# Patient Record
Sex: Male | Born: 1950
Health system: Southern US, Community
[De-identification: ages and names within clinical notes are randomized; demographics above are authoritative.]

## PROBLEM LIST (undated history)

## (undated) DIAGNOSIS — I10 Essential (primary) hypertension: Secondary | ICD-10-CM

## (undated) DIAGNOSIS — E785 Hyperlipidemia, unspecified: Secondary | ICD-10-CM

## (undated) DIAGNOSIS — H269 Unspecified cataract: Secondary | ICD-10-CM

## (undated) DIAGNOSIS — Z972 Presence of dental prosthetic device (complete) (partial): Secondary | ICD-10-CM

## (undated) DIAGNOSIS — R7303 Prediabetes: Secondary | ICD-10-CM

## (undated) HISTORY — PX: APPENDECTOMY: SHX54

## (undated) HISTORY — PX: CATARACT EXTRACTION, BILATERAL: SHX1313

## (undated) HISTORY — DX: Essential (primary) hypertension: I10

## (undated) HISTORY — DX: Unspecified cataract: H26.9

## (undated) HISTORY — PX: HERNIA REPAIR: SHX51

## (undated) HISTORY — PX: TONSILLECTOMY: SUR1361

## (undated) HISTORY — DX: Hyperlipidemia, unspecified: E78.5

---

## 2003-12-30 ENCOUNTER — Other Ambulatory Visit: Payer: Self-pay

## 2007-07-04 ENCOUNTER — Ambulatory Visit: Payer: Self-pay | Admitting: Gastroenterology

## 2007-07-04 LAB — HM COLONOSCOPY

## 2008-08-10 ENCOUNTER — Ambulatory Visit: Payer: Self-pay | Admitting: General Surgery

## 2008-08-17 ENCOUNTER — Ambulatory Visit: Payer: Self-pay | Admitting: General Surgery

## 2009-04-23 ENCOUNTER — Ambulatory Visit: Payer: Self-pay | Admitting: Cardiology

## 2009-04-23 HISTORY — PX: CARDIAC CATHETERIZATION: SHX172

## 2010-12-09 ENCOUNTER — Ambulatory Visit: Payer: Self-pay | Admitting: Unknown Physician Specialty

## 2010-12-30 ENCOUNTER — Ambulatory Visit: Payer: Self-pay | Admitting: Unknown Physician Specialty

## 2015-02-22 LAB — BASIC METABOLIC PANEL
BUN: 13 mg/dL (ref 4–21)
CREATININE: 1 mg/dL (ref <=1.3)
Glucose: 121 mg/dL
Potassium: 5 mmol/L (ref 3.4–5.3)
Sodium: 141 mmol/L (ref 137–147)

## 2015-02-22 LAB — HEPATIC FUNCTION PANEL
ALK PHOS: 104 U/L (ref 25–125)
ALT: 45 U/L — AB (ref 10–40)
AST: 28 U/L (ref 14–40)
BILIRUBIN, TOTAL: 0.3 mg/dL

## 2015-02-22 LAB — CBC AND DIFFERENTIAL
HCT: 42 % (ref 41–53)
HEMOGLOBIN: 14.2 g/dL (ref 13.5–17.5)
Neutrophils Absolute: 68 /uL
Platelets: 244 10*3/uL (ref 150–399)
WBC: 10 10*3/mL

## 2015-02-22 LAB — PSA: PSA: 1.4

## 2015-02-22 LAB — LIPID PANEL
Cholesterol: 145 mg/dL (ref 0–200)
HDL: 23 mg/dL — AB (ref 35–70)
Triglycerides: 480 mg/dL — AB (ref 40–160)

## 2015-02-22 LAB — HEMOGLOBIN A1C: Hgb A1c MFr Bld: 7.1 % — AB (ref 4.0–6.0)

## 2015-02-22 LAB — TSH: TSH: 1.42 u[IU]/mL (ref <=5.90)

## 2015-03-28 DIAGNOSIS — F419 Anxiety disorder, unspecified: Secondary | ICD-10-CM | POA: Insufficient documentation

## 2015-03-28 DIAGNOSIS — E669 Obesity, unspecified: Secondary | ICD-10-CM | POA: Insufficient documentation

## 2015-03-28 DIAGNOSIS — F329 Major depressive disorder, single episode, unspecified: Secondary | ICD-10-CM | POA: Insufficient documentation

## 2015-03-28 DIAGNOSIS — M109 Gout, unspecified: Secondary | ICD-10-CM | POA: Insufficient documentation

## 2015-03-28 DIAGNOSIS — M1990 Unspecified osteoarthritis, unspecified site: Secondary | ICD-10-CM | POA: Insufficient documentation

## 2015-03-28 DIAGNOSIS — E349 Endocrine disorder, unspecified: Secondary | ICD-10-CM | POA: Insufficient documentation

## 2015-03-28 DIAGNOSIS — E559 Vitamin D deficiency, unspecified: Secondary | ICD-10-CM | POA: Insufficient documentation

## 2015-03-28 DIAGNOSIS — E119 Type 2 diabetes mellitus without complications: Secondary | ICD-10-CM | POA: Insufficient documentation

## 2015-03-28 DIAGNOSIS — K219 Gastro-esophageal reflux disease without esophagitis: Secondary | ICD-10-CM | POA: Insufficient documentation

## 2015-03-28 DIAGNOSIS — E785 Hyperlipidemia, unspecified: Secondary | ICD-10-CM | POA: Insufficient documentation

## 2015-03-28 DIAGNOSIS — F32A Depression, unspecified: Secondary | ICD-10-CM | POA: Insufficient documentation

## 2015-03-28 DIAGNOSIS — E7800 Pure hypercholesterolemia, unspecified: Secondary | ICD-10-CM | POA: Insufficient documentation

## 2015-03-28 DIAGNOSIS — Z720 Tobacco use: Secondary | ICD-10-CM | POA: Insufficient documentation

## 2015-04-14 ENCOUNTER — Encounter: Payer: Self-pay | Admitting: Emergency Medicine

## 2015-04-14 ENCOUNTER — Emergency Department: Payer: PRIVATE HEALTH INSURANCE

## 2015-04-14 ENCOUNTER — Emergency Department
Admission: EM | Admit: 2015-04-14 | Discharge: 2015-04-14 | Disposition: A | Discharge status: Home / Self Care | Payer: PRIVATE HEALTH INSURANCE | Attending: Emergency Medicine | Admitting: Emergency Medicine

## 2015-04-14 DIAGNOSIS — R197 Diarrhea, unspecified: Secondary | ICD-10-CM | POA: Insufficient documentation

## 2015-04-14 DIAGNOSIS — Z7952 Long term (current) use of systemic steroids: Secondary | ICD-10-CM | POA: Insufficient documentation

## 2015-04-14 DIAGNOSIS — Z79899 Other long term (current) drug therapy: Secondary | ICD-10-CM | POA: Diagnosis not present

## 2015-04-14 DIAGNOSIS — E119 Type 2 diabetes mellitus without complications: Secondary | ICD-10-CM | POA: Insufficient documentation

## 2015-04-14 DIAGNOSIS — J159 Unspecified bacterial pneumonia: Secondary | ICD-10-CM | POA: Diagnosis not present

## 2015-04-14 DIAGNOSIS — R509 Fever, unspecified: Secondary | ICD-10-CM | POA: Diagnosis present

## 2015-04-14 DIAGNOSIS — Z720 Tobacco use: Secondary | ICD-10-CM | POA: Insufficient documentation

## 2015-04-14 DIAGNOSIS — R0789 Other chest pain: Secondary | ICD-10-CM | POA: Insufficient documentation

## 2015-04-14 DIAGNOSIS — Z792 Long term (current) use of antibiotics: Secondary | ICD-10-CM | POA: Insufficient documentation

## 2015-04-14 DIAGNOSIS — J189 Pneumonia, unspecified organism: Secondary | ICD-10-CM

## 2015-04-14 LAB — COMPREHENSIVE METABOLIC PANEL
ALBUMIN: 3.7 g/dL (ref 3.5–5.0)
ALT: 18 U/L (ref 17–63)
AST: 19 U/L (ref 15–41)
Alkaline Phosphatase: 74 U/L (ref 38–126)
Anion gap: 11 (ref 5–15)
BUN: 17 mg/dL (ref 6–20)
CO2: 24 mmol/L (ref 22–32)
Calcium: 8.8 mg/dL — ABNORMAL LOW (ref 8.9–10.3)
Chloride: 100 mmol/L — ABNORMAL LOW (ref 101–111)
Creatinine, Ser: 1.6 mg/dL — ABNORMAL HIGH (ref 0.61–1.24)
GFR calc Af Amer: 52 mL/min — ABNORMAL LOW (ref >60)
GFR calc non Af Amer: 45 mL/min — ABNORMAL LOW (ref >60)
Glucose, Bld: 220 mg/dL — ABNORMAL HIGH (ref 65–99)
POTASSIUM: 3.5 mmol/L (ref 3.5–5.1)
SODIUM: 135 mmol/L (ref 135–145)
TOTAL PROTEIN: 8 g/dL (ref 6.5–8.1)
Total Bilirubin: 0.6 mg/dL (ref 0.3–1.2)

## 2015-04-14 LAB — CBC WITH DIFFERENTIAL/PLATELET
BASOS ABS: 0.1 10*3/uL (ref 0–0.1)
Basophils Relative: 0 %
Eosinophils Absolute: 0 10*3/uL (ref 0–0.7)
Eosinophils Relative: 0 %
HCT: 40.5 % (ref 40.0–52.0)
Hemoglobin: 13.7 g/dL (ref 13.0–18.0)
LYMPHS ABS: 0.7 10*3/uL — AB (ref 1.0–3.6)
Lymphocytes Relative: 6 %
MCH: 30.2 pg (ref 26.0–34.0)
MCHC: 33.7 g/dL (ref 32.0–36.0)
MCV: 89.6 fL (ref 80.0–100.0)
MONO ABS: 1.1 10*3/uL — AB (ref 0.2–1.0)
MONOS PCT: 9 %
NEUTROS ABS: 10 10*3/uL — AB (ref 1.4–6.5)
Neutrophils Relative %: 85 %
Platelets: 224 10*3/uL (ref 150–440)
RBC: 4.52 MIL/uL (ref 4.40–5.90)
RDW: 13.9 % (ref 11.5–14.5)
WBC: 11.8 10*3/uL — ABNORMAL HIGH (ref 3.8–10.6)

## 2015-04-14 MED ORDER — LEVOFLOXACIN 750 MG PO TABS: 750.0000 mg | ORAL_TABLET | Freq: Every day | ORAL | Status: DC

## 2015-04-14 MED ORDER — LEVOFLOXACIN 750 MG PO TABS
750.0000 mg | ORAL_TABLET | Freq: Once | ORAL | Status: AC
Start: 2015-04-14 — End: 2015-04-14
  Administered 2015-04-14: 750 mg via ORAL

## 2015-04-14 MED ORDER — LEVOFLOXACIN 750 MG PO TABS
ORAL_TABLET | ORAL | Status: AC
  Administered 2015-04-14: 750 mg via ORAL
  Filled 2015-04-14: qty 1

## 2015-04-14 NOTE — ED Notes (Signed)
Patient transported to X-ray 

## 2015-04-14 NOTE — ED Notes (Signed)
Pt also reports he's been bit by a few ticks in the past month.

## 2015-04-14 NOTE — ED Provider Notes (Signed)
[facility] Emergency Department Provider Note   ____________________________________________  Time seen: 2000  I have reviewed the triage vital signs and the nursing notes.   HISTORY  Chief Complaint Fever   History limited by: Not Limited   HPI Mathew Hill is a 64 y.o. male who presents to the emergency department today because of concerns for not feeling well. He states he has been feeling well for the past couple of days. It started with multiple episodes of diarrhea. He then developed a headache and fevers. Additionally patient has had some right-sided chest pain and shortness of breath. This was worse yesterday. The patient denies any obvious sick contacts. He works as a Pharmacist, community.     History reviewed. No pertinent past medical history.  Patient Active Problem List   Diagnosis Date Noted  . Anxiety 03/28/2015  . Arthritis 03/28/2015  . Clinical depression 03/28/2015  . Diabetes 03/28/2015  . Acid reflux 03/28/2015  . Gout 03/28/2015  . HLD (hyperlipidemia) 03/28/2015  . Adiposity 03/28/2015  . Hypercholesterolemia without hypertriglyceridemia 03/28/2015  . Hypotestosteronism 03/28/2015  . Current tobacco use 03/28/2015  . Avitaminosis D 03/28/2015    Past Surgical History  Procedure Laterality Date  . Cardiac catheterization  04/23/09  . Hernia repair    . Appendectomy      Current Outpatient Rx  Name  Route  Sig  Dispense  Refill  . ALPRAZolam (XANAX) 1 MG tablet   Oral   Take by mouth.         . Cholecalciferol (VITAMIN D3) 5000 UNITS TABS   Oral   Take by mouth.         . Garcinia Cambogia-Chromium 500-200 MG-MCG TABS   Oral   Take by mouth.         . hydrocortisone (ANUSOL-HC) 25 MG suppository   Rectal   Place rectally.         . indomethacin (INDOCIN) 50 MG capsule   Oral   Take by mouth.         . levofloxacin (LEVAQUIN) 750 MG tablet   Oral   Take 1 tablet (750 mg total) by mouth daily.   5  tablet   0   . Misc Natural Products (COLON CLEANSE) CAPS   Oral   Take by mouth.         Marland Kitchen omeprazole (PRILOSEC) 40 MG capsule   Oral   Take by mouth.         . simvastatin (ZOCOR) 40 MG tablet   Oral   Take by mouth.         . Testosterone 20.25 MG/ACT (1.62%) GEL   Transdermal   Place onto the skin.         Marland Kitchen venlafaxine XR (EFFEXOR-XR) 75 MG 24 hr capsule   Oral   Take by mouth.           Allergies Codeine  Family History  Problem Relation Age of Onset  . Dementia Mother   . Heart attack Sister   . Depression Sister     Social History History  Substance Use Topics  . Smoking status: Current Every Day Smoker -- 1.00 packs/day    Types: Cigarettes  . Smokeless tobacco: Not on file  . Alcohol Use: No    Review of Systems  Constitutional: Positive for fever. Cardiovascular: Positive for chest pain. Respiratory: Positive for shortness of breath. Gastrointestinal: Negative for abdominal pain, vomiting and diarrhea. Genitourinary: Negative for dysuria. Musculoskeletal: Negative  for back pain. Skin: Negative for rash. Neurological: Negative for headaches, focal weakness or numbness.   10-point ROS otherwise negative.  ____________________________________________   PHYSICAL EXAM:  VITAL SIGNS: ED Triage Vitals  Enc Vitals Group     BP 04/14/15 1903 123/64 mmHg     Pulse Rate 04/14/15 1903 98     Resp 04/14/15 1903 19     Temp 04/14/15 1903 98.7 F (37.1 C)     Temp Source 04/14/15 1903 Oral     SpO2 04/14/15 1903 96 %     Weight 04/14/15 1903 280 lb (127.007 kg)     Height 04/14/15 1903 5\' 11"  (1.803 m)     Head Cir --      Peak Flow --      Pain Score 04/14/15 1904 10   Constitutional: Alert and oriented. Well appearing and in no distress. Eyes: Conjunctivae are normal. PERRL. Normal extraocular movements. ENT   Head: Normocephalic and atraumatic.   Nose: No congestion/rhinnorhea.   Mouth/Throat: Mucous membranes are  moist.   Neck: No stridor. Hematological/Lymphatic/Immunilogical: No cervical lymphadenopathy. Cardiovascular: Normal rate, regular rhythm.  No murmurs, rubs, or gallops. Respiratory: Normal respiratory effort without tachypnea nor retractions. Breath sounds are clear and equal bilaterally. No wheezes/rales/rhonchi. Gastrointestinal: Soft and nontender. No distention.  Genitourinary: Deferred Musculoskeletal: Normal range of motion in all extremities. No joint effusions.  No lower extremity tenderness nor edema. Neurologic:  Normal speech and language. No gross focal neurologic deficits are appreciated. Speech is normal.  Skin:  Skin is warm, dry and intact. No rash noted. Psychiatric: Mood and affect are normal. Speech and behavior are normal. Patient exhibits appropriate insight and judgment.  ____________________________________________    LABS (pertinent positives/negatives)  Labs Reviewed  CBC WITH DIFFERENTIAL/PLATELET - Abnormal; Notable for the following:    WBC 11.8 (*)    Neutro Abs 10.0 (*)    Lymphs Abs 0.7 (*)    Monocytes Absolute 1.1 (*)    All other components within normal limits  COMPREHENSIVE METABOLIC PANEL - Abnormal; Notable for the following:    Chloride 100 (*)    Glucose, Bld 220 (*)    Creatinine, Ser 1.60 (*)    Calcium 8.8 (*)    GFR calc non Af Amer 45 (*)    GFR calc Af Amer 52 (*)    All other components within normal limits  CULTURE, BLOOD (ROUTINE X 2)  CULTURE, BLOOD (ROUTINE X 2)  URINALYSIS COMPLETEWITH MICROSCOPIC ([facility] ONLY)     ____________________________________________   EKG  None  ____________________________________________    RADIOLOGY  CXR  IMPRESSION: Right upper lobe pneumonia.  ____________________________________________   PROCEDURES  Procedure(s) performed: None  Critical Care performed: No  ____________________________________________   INITIAL IMPRESSION / ASSESSMENT AND PLAN / ED  COURSE  Pertinent labs & imaging results that were available during my care of the patient were reviewed by me and considered in my medical decision making (see chart for details).  Patient presented to the emergency department today because of multiple medical problems. Chest x-ray does show a pneumonia. I had a discussion with the family about pneumonia. Will give first dose of antibiotics here and discharged home with prescription for Levaquin. Patient scores a 0 on curb 65  ____________________________________________   FINAL CLINICAL IMPRESSION(S) / ED DIAGNOSES  Final diagnoses:  Community acquired pneumonia     Nance Pear, MD 04/14/15 2209

## 2015-04-14 NOTE — Discharge Instructions (Signed)
Please seek medical attention for any high fevers, chest pain, shortness of breath, change in behavior, persistent vomiting, bloody stool or any other new or concerning symptoms. ° ° °Pneumonia °Pneumonia is an infection of the lungs.  °CAUSES °Pneumonia may be caused by bacteria or a virus. Usually, these infections are caused by breathing infectious particles into the lungs (respiratory tract). °SIGNS AND SYMPTOMS  °· Cough. °· Fever. °· Chest pain. °· Increased rate of breathing. °· Wheezing. °· Mucus production. °DIAGNOSIS  °If you have the common symptoms of pneumonia, your health care provider will typically confirm the diagnosis with a chest X-ray. The X-ray will show an abnormality in the lung (pulmonary infiltrate) if you have pneumonia. Other tests of your blood, urine, or sputum may be done to find the specific cause of your pneumonia. Your health care provider may also do tests (blood gases or pulse oximetry) to see how well your lungs are working. °TREATMENT  °Some forms of pneumonia may be spread to other people when you cough or sneeze. You may be asked to wear a mask before and during your exam. Pneumonia that is caused by bacteria is treated with antibiotic medicine. Pneumonia that is caused by the influenza virus may be treated with an antiviral medicine. Most other viral infections must run their course. These infections will not respond to antibiotics.  °HOME CARE INSTRUCTIONS  °· Cough suppressants may be used if you are losing too much rest. However, coughing protects you by clearing your lungs. You should avoid using cough suppressants if you can. °· Your health care provider may have prescribed medicine if he or she thinks your pneumonia is caused by bacteria or influenza. Finish your medicine even if you start to feel better. °· Your health care provider may also prescribe an expectorant. This loosens the mucus to be coughed up. °· Take medicines only as directed by your health care  provider. °· Do not smoke. Smoking is a common cause of bronchitis and can contribute to pneumonia. If you are a smoker and continue to smoke, your cough may last several weeks after your pneumonia has cleared. °· A cold steam vaporizer or humidifier in your room or home may help loosen mucus. °· Coughing is often worse at night. Sleeping in a semi-upright position in a recliner or using a couple pillows under your head will help with this. °· Get rest as you feel it is needed. Your body will usually let you know when you need to rest. °PREVENTION °A pneumococcal shot (vaccine) is available to prevent a common bacterial cause of pneumonia. This is usually suggested for: °· People over 65 years old. °· Patients on chemotherapy. °· People with chronic lung problems, such as bronchitis or emphysema. °· People with immune system problems. °If you are over 65 or have a high risk condition, you may receive the pneumococcal vaccine if you have not received it before. In some countries, a routine influenza vaccine is also recommended. This vaccine can help prevent some cases of pneumonia. You may be offered the influenza vaccine as part of your care. °If you smoke, it is time to quit. You may receive instructions on how to stop smoking. Your health care provider can provide medicines and counseling to help you quit. °SEEK MEDICAL CARE IF: °You have a fever. °SEEK IMMEDIATE MEDICAL CARE IF:  °· Your illness becomes worse. This is especially true if you are elderly or weakened from any other disease. °· You cannot control your cough with   suppressants and are losing sleep. °· You begin coughing up blood. °· You develop pain which is getting worse or is uncontrolled with medicines. °· Any of the symptoms which initially brought you in for treatment are getting worse rather than better. °· You develop shortness of breath or chest pain. °MAKE SURE YOU:  °· Understand these instructions. °· Will watch your condition. °· Will get  help right away if you are not doing well or get worse. °Document Released: 10/16/2005 Document Revised: 03/02/2014 Document Reviewed: 01/05/2011 °ExitCare® Patient Information ©2015 [employer]. This information is not intended to replace advice given to you by your health care provider. Make sure you discuss any questions you have with your health care provider. ° °

## 2015-04-14 NOTE — ED Notes (Signed)
Pt here from home via POV with c/o fever x2 days. Pt given tylenol by [facility] PTA to arrival; wife reports pt started having chest pain last night, denies any at this time.

## 2015-04-14 NOTE — ED Notes (Signed)
Pt alert and in NAD at time of d/c 

## 2015-04-19 LAB — CULTURE, BLOOD (ROUTINE X 2): Culture: NO GROWTH

## 2015-04-20 ENCOUNTER — Encounter: Payer: Self-pay | Admitting: Family Medicine

## 2015-04-20 ENCOUNTER — Ambulatory Visit (INDEPENDENT_AMBULATORY_CARE_PROVIDER_SITE_OTHER): Payer: PRIVATE HEALTH INSURANCE | Admitting: Family Medicine

## 2015-04-20 VITALS — BP 116/64 | HR 70 | Temp 98.2°F | Resp 18 | Ht 71.0 in | Wt 280.0 lb

## 2015-04-20 DIAGNOSIS — J189 Pneumonia, unspecified organism: Secondary | ICD-10-CM | POA: Diagnosis not present

## 2015-04-20 NOTE — Progress Notes (Signed)
Patient ID: Mathew Hill, male   DOB: 1951/06/28, 64 y.o.   MRN: [id number]   CAELLUM MANCIL  MRN: [id number] DOB: Apr 11, 1951  Subjective:  HPI   1. Pneumonia, organism unspecified Patient was seen in the ED on April 14, 2015 and was diagnosed with pneumonia.  His illness began on June 6.  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 He finished the trip on Saturday April 05, 2015 and states that he just felt really bad, and just lie around for Sat and Sun.  On Monday he started with fever (101.5-105.5), chills, body aches, lightheaded with activity.  Patient had right sided pain on Tuesday.  He was unable to sleep on that side.  By Wed his temperature was up to 103 and that is when he decided to go to the ED.  His pneumonia was on the right side.  He was treated with Levoflaxacin 750 mg # 6.  He has finished his antibiotic and states he feels significantly better other than he has no energy.   Patient Active Problem List   Diagnosis Date Noted  . Anxiety 03/28/2015  . Arthritis 03/28/2015  . Clinical depression 03/28/2015  . Diabetes 03/28/2015  . Acid reflux 03/28/2015  . Gout 03/28/2015  . HLD (hyperlipidemia) 03/28/2015  . Adiposity 03/28/2015  . Hypercholesterolemia without hypertriglyceridemia 03/28/2015  . Hypotestosteronism 03/28/2015  . Current tobacco use 03/28/2015  . Avitaminosis D 03/28/2015    No past medical history on file.  History   Social History  . Marital Status: Married    Spouse Name: N/A  . Number of Children: N/A  . Years of Education: N/A   Occupational History  . Not on file.   Social History Main Topics  . Smoking status: Current Every Day Smoker -- 1.00 packs/day for 45 years    Types: Cigarettes  . Smokeless tobacco: Not on file     Comment: patient is in the process of quitting  . Alcohol Use: No  . Drug Use: No  . Sexual Activity: Not on file   Other Topics Concern  . Not on file    Social History Narrative    Outpatient Prescriptions Prior to Visit  Medication Sig Dispense Refill  . ALPRAZolam (XANAX) 1 MG tablet Take by mouth.    . Cholecalciferol (VITAMIN D3) 5000 UNITS TABS Take by mouth.    . Garcinia Cambogia-Chromium 500-200 MG-MCG TABS Take by mouth.    . hydrocortisone (ANUSOL-HC) 25 MG suppository Place rectally.    . indomethacin (INDOCIN) 50 MG capsule Take by mouth.    Marland Kitchen omeprazole (PRILOSEC) 40 MG capsule Take by mouth.    . simvastatin (ZOCOR) 40 MG tablet Take by mouth.    . Testosterone 20.25 MG/ACT (1.62%) GEL Place onto the skin.    Marland Kitchen venlafaxine XR (EFFEXOR-XR) 75 MG 24 hr capsule Take by mouth.    . levofloxacin (LEVAQUIN) 750 MG tablet Take 1 tablet (750 mg total) by mouth daily. 5 tablet 0  . Misc Natural Products (COLON CLEANSE) CAPS Take by mouth.     No facility-administered medications prior to visit.    Allergies  Allergen Reactions  . Codeine     GI Upset    Review of Systems  Constitutional: Positive for malaise/fatigue and diaphoresis (sweats). Negative for fever and chills.  Respiratory: Positive for cough (Very little).   Cardiovascular: Negative.   Skin: Negative.   Neurological: Positive for  weakness. Negative for headaches.      Objective:  BP 116/64 mmHg  Pulse 70  Temp(Src) 98.2 F (36.8 C) (Oral)  Resp 18  Ht 5\' 11"  (1.803 m)  Wt 280 lb (127.007 kg)  BMI 39.07 kg/m2  SpO2 98%  Physical Exam  Assessment and Plan :  Pneumonia, organism unspecified  Patient feeling much better but still tired. Clear to return to work next Monday.  Miguel Aschoff MD [facility] 04/20/2015 2:32 PM

## 2015-06-28 ENCOUNTER — Ambulatory Visit: Payer: Self-pay | Admitting: Family Medicine

## 2016-04-13 ENCOUNTER — Ambulatory Visit (HOSPITAL_COMMUNITY)
Admission: EM | Admit: 2016-04-13 | Discharge: 2016-04-13 | Disposition: A | Discharge status: Home / Self Care | Payer: Worker's Compensation | Attending: Emergency Medicine | Admitting: Emergency Medicine

## 2016-04-13 ENCOUNTER — Ambulatory Visit (INDEPENDENT_AMBULATORY_CARE_PROVIDER_SITE_OTHER): Payer: Worker's Compensation

## 2016-04-13 ENCOUNTER — Encounter (HOSPITAL_COMMUNITY): Payer: Self-pay | Admitting: Emergency Medicine

## 2016-04-13 DIAGNOSIS — M25562 Pain in left knee: Secondary | ICD-10-CM | POA: Diagnosis not present

## 2016-04-13 DIAGNOSIS — S83207A Unspecified tear of unspecified meniscus, current injury, left knee, initial encounter: Secondary | ICD-10-CM | POA: Diagnosis not present

## 2016-04-13 MED ORDER — PREDNISONE 50 MG PO TABS: ORAL_TABLET | ORAL | Status: DC

## 2016-04-13 MED ORDER — IBUPROFEN 600 MG PO TABS: 600.0000 mg | ORAL_TABLET | Freq: Four times a day (QID) | ORAL | Status: DC | PRN

## 2016-04-13 NOTE — ED Notes (Signed)
PT fell out of the trailer of a transfer truck. PT states, "Both of my knees twisted and then I fell back and my head bounced off of the pavement." PT reports severe pain in left knee and states, "It feels like a bag of potato chips when I walk." This happened at 0330 am yesterday.

## 2016-04-13 NOTE — ED Provider Notes (Signed)
CSN: [id number]     Arrival date & time 04/13/16  1559 History   First MD Initiated Contact with Patient 04/13/16 1646     Chief Complaint  Patient presents with  . Fall   (Consider location/radiation/quality/duration/timing/severity/associated sxs/prior Treatment) HPI He is a 65 year old man here for evaluation of left knee pain after fall. He states that yesterday morning around 3:30 AM he slipped in the trailer of his truck due to a wet floor. He landed outside the trailer on his feet, but his knees twisted causing him to fall and hit the back of his head on pavement. He denies any loss of consciousness. He did have some bleeding from the scalp, but this has subsided. He denies any current headache. No vision changes. No nausea or vomiting. No dizziness. No focal numbness, tingling, weakness. He does report a little bit of discomfort when turning his head in the neck, but denies any radicular symptoms.  The most bothersome thing for him is the left knee. He states it does seem a little swollen. He is able to walk on it for anywhere from 5-10 steps, but then will feel like it's about to give out. He also reports hearing some crunching noises in the knee. He has not taken any medications.  History reviewed. No pertinent past medical history. Past Surgical History  Procedure Laterality Date  . Cardiac catheterization  04/23/09  . Hernia repair    . Appendectomy     Family History  Problem Relation Age of Onset  . Dementia Mother   . Heart attack Sister   . Depression Sister   . Heart disease Sister    Social History  Substance Use Topics  . Smoking status: Current Every Day Smoker -- 1.00 packs/day for 45 years    Types: Cigarettes  . Smokeless tobacco: None     Comment: patient is in the process of quitting  . Alcohol Use: 0.0 oz/week    0 Standard drinks or equivalent per week     Comment: social    Review of Systems As in history of present illness Allergies   Codeine  Home Medications   Prior to Admission medications   Medication Sig Start Date End Date Taking? Authorizing Provider  Cholecalciferol (VITAMIN D3) 5000 UNITS TABS Take by mouth.   Yes Historical Provider, MD  omeprazole (PRILOSEC) 40 MG capsule Take by mouth. 02/22/15  Yes Historical Provider, MD  simvastatin (ZOCOR) 40 MG tablet Take by mouth. 02/22/15  Yes Historical Provider, MD  venlafaxine XR (EFFEXOR-XR) 75 MG 24 hr capsule Take 150 mg by mouth.  02/22/15  Yes Historical Provider, MD  ALPRAZolam Duanne Moron) 1 MG tablet Take by mouth. 02/22/15   Historical Provider, MD  Garcinia Cambogia-Chromium 500-200 MG-MCG TABS Take by mouth.    Historical Provider, MD  ibuprofen (ADVIL,MOTRIN) 600 MG tablet Take 1 tablet (600 mg total) by mouth every 6 (six) hours as needed for moderate pain. 04/13/16   Melony Overly, MD  indomethacin (INDOCIN) 50 MG capsule Take by mouth. 01/29/13   Historical Provider, MD  predniSONE (DELTASONE) 50 MG tablet Take 1 pill daily for 5 days. 04/13/16   Melony Overly, MD   Meds Ordered and Administered this Visit  Medications - No data to display  BP 142/66 mmHg  Pulse 82  Temp(Src) 98.7 F (37.1 C) (Oral)  Resp 16  SpO2 98% No data found.   Physical Exam  Constitutional: He is oriented to person, place, and time. He appears  well-developed and well-nourished. No distress.  Neck: Neck supple.  Cardiovascular: Normal rate and regular rhythm.   Pulmonary/Chest: Effort normal.  Musculoskeletal:  Left knee: Mild swelling of the lateral aspect. No appreciable joint effusion. No point tenderness. No joint laxity. Positive McMurray's.  Neurological: He is alert and oriented to person, place, and time. Coordination normal.  Skin:  He does have a superficial abrasion to the crown of the head.    ED Course  Procedures (including critical care time)  Labs Review Labs Reviewed - No data to display  Imaging Review Dg Knee Complete 4 Views Left  04/13/2016   CLINICAL DATA:  [place] [address] yesterday with pain and swelling in the left knee, difficulty bearing weight EXAM: LEFT KNEE - COMPLETE 4+ VIEW COMPARISON:  MRI of the left knee of 12/09/2010 FINDINGS: There is tricompartmental degenerative joint disease of the left knee primarily involving the medial compartment where there is considerable loss of joint space and sclerosis with spurring. No acute fracture is seen, but there is a moderate size left knee joint effusion present. Also there appear to be loose bodies in the posterior joint space. IMPRESSION: 1. Tricompartmental degenerative joint disease left knee primarily involving the medial compartment. 2. Moderate size left knee joint effusion. 3. Loose bodies in the posterior joint space. Electronically Signed   By: Ivar Drape M.D.   On: 04/13/2016 17:25     MDM   1. Left knee pain   2. Acute meniscal tear of knee, left, initial encounter    X-ray shows arthritis and loose bodies. With the effusion on x-ray and exam, I suspect he has a meniscal tear. Knee sleeve provided. Symptomatic treatment with ice, prednisone, and meloxicam. He is okay to return to work tomorrow. Note provided. Follow-up with orthopedics if this is not improving or he has recurring trouble.    Melony Overly, MD 04/13/16 [phone]

## 2016-04-13 NOTE — Discharge Instructions (Signed)
I suspect you tore the meniscus in your knee when you fell. Wear the sleeve during the day when you are driving. Ice the knee as often as you can. Take prednisone daily for 5 days to help with the swelling and inflammation. Take ibuprofen every 6 hours as needed for pain. If you develop recurrent problems with the knee, please follow-up with the orthopedic doctor.  There is no sign of head or neck injury.

## 2016-05-23 ENCOUNTER — Telehealth: Payer: Self-pay | Admitting: Emergency Medicine

## 2016-05-23 NOTE — Telephone Encounter (Signed)
Pt is calling and was seen in the ER for a fall at work. He is being treated by a workman's comp dr. He was told to call his PCP to see if he could take Naproxen with Venlafaxine. He is a Dr. Rosanna Randy Pt, but pt says he has been seen by you before. Please advise.

## 2016-05-24 NOTE — Telephone Encounter (Signed)
Ok to take Naproxen.

## 2016-05-29 ENCOUNTER — Ambulatory Visit: Payer: Self-pay | Admitting: Family Medicine

## 2016-06-08 DIAGNOSIS — M25562 Pain in left knee: Secondary | ICD-10-CM | POA: Insufficient documentation

## 2016-06-08 DIAGNOSIS — M542 Cervicalgia: Secondary | ICD-10-CM | POA: Insufficient documentation

## 2016-06-08 DIAGNOSIS — M25561 Pain in right knee: Secondary | ICD-10-CM | POA: Insufficient documentation

## 2016-06-21 ENCOUNTER — Other Ambulatory Visit: Payer: Self-pay | Admitting: Family Medicine

## 2016-06-21 NOTE — Telephone Encounter (Signed)
Pt contacted office for refill request on the following medications: 1. ALPRAZolam (XANAX) 1 MG tablet 2. venlafaxine XR (EFFEXOR-XR) 75 MG 24 hr capsule 3. simvastatin (ZOCOR) 40 MG tablet 4. omeprazole (PRILOSEC) 40 MG capsule  5. AndroGel Pump 20.25mg /act [facility] Last written: 02/22/15 Last OV: 04/20/15 Pt stated he will come in for an OV as soon as he can. Pt stated he can't afford and OV at this time or pay towards current balance. Pt stated that he is out of work for workers comp injury. Please advise. Thanks TNP

## 2016-06-21 NOTE — Telephone Encounter (Signed)
He can have 3 months of refills for everything except for the Xanax.

## 2016-06-21 NOTE — Telephone Encounter (Signed)
Please advise 

## 2016-06-22 MED ORDER — VENLAFAXINE HCL ER 75 MG PO CP24
225.0000 mg | ORAL_CAPSULE | Freq: Every day | ORAL | 3 refills | Status: DC
Start: 2016-06-22 — End: 2016-06-26

## 2016-06-22 MED ORDER — SIMVASTATIN 40 MG PO TABS: 40.0000 mg | ORAL_TABLET | Freq: Every day | ORAL | 3 refills | Status: DC

## 2016-06-22 MED ORDER — OMEPRAZOLE 40 MG PO CPDR: 40.0000 mg | DELAYED_RELEASE_CAPSULE | Freq: Every day | ORAL | 3 refills | Status: DC

## 2016-06-22 MED ORDER — TESTOSTERONE 20.25 MG/ACT (1.62%) TD GEL: TRANSDERMAL | 3 refills | Status: DC

## 2016-06-22 NOTE — Telephone Encounter (Signed)
RX refilled as below and pt advised-aa

## 2016-06-26 ENCOUNTER — Other Ambulatory Visit: Payer: Self-pay | Admitting: Family Medicine

## 2016-06-26 MED ORDER — VENLAFAXINE HCL ER 75 MG PO CP24: 225.0000 mg | ORAL_CAPSULE | Freq: Every day | ORAL | 3 refills | Status: DC

## 2016-06-26 MED ORDER — OMEPRAZOLE 40 MG PO CPDR: 40.0000 mg | DELAYED_RELEASE_CAPSULE | Freq: Every day | ORAL | 3 refills | Status: DC

## 2016-06-26 MED ORDER — SIMVASTATIN 40 MG PO TABS: 40.0000 mg | ORAL_TABLET | Freq: Every day | ORAL | 3 refills | Status: DC

## 2016-06-26 NOTE — Telephone Encounter (Signed)
Pt stated that his insurance doesn't cover [facility] and is requesting the medication refills for the following medications that we written on 06/22/16  be sent to [facility].  1. ALPRAZolam (XANAX) 1 MG tablet 2. venlafaxine XR (EFFEXOR-XR) 75 MG 24 hr capsule 3. simvastatin (ZOCOR) 40 MG tablet 4. omeprazole (PRILOSEC) 40 MG capsule  5. AndroGel Pump 20.25mg /act  Thanks TNP

## 2016-06-26 NOTE — Telephone Encounter (Signed)
Done-aa 

## 2016-07-17 ENCOUNTER — Telehealth: Payer: Self-pay | Admitting: Family Medicine

## 2016-07-17 NOTE — Telephone Encounter (Signed)
Pharmacy needs clarification on the androgel rx.  They said they don't understand the direction that were sent in.  Thanks [employer]

## 2016-07-18 NOTE — Telephone Encounter (Signed)
Spoke with pharmacist and he had 2 questions, 1-they received 2 RXs for Androgel one on 06/22/16 and then yesterday and advised that was a mistake just 1 RX needs to be filled. 2- the dispense quantity was written for 75 g which will not last 30 days but only 15 so that quantity needed to be changed going by how much he has to use daily. Pharmacist advised of corrections-aa

## 2016-08-07 ENCOUNTER — Telehealth: Payer: Self-pay | Admitting: Family Medicine

## 2016-08-07 NOTE — Telephone Encounter (Signed)
Pt stated that he has been going to [facility] for treatment from falling off a truck and is wanting to know if the medications he was given was ok to take with the medications Dr. Rosanna Randy has him on. Pt stated that sometimes when he takes the medication he feels a little swimmy headed. The following medications are what he was given from ortho: Meloxicam 15 mg 1 a day Cyclobenzaprine 10 mg 1 3 X a day Tramadol HCI 50 mg 1 every 6 hours Please advise. Thanks TNP

## 2016-08-07 NOTE — Telephone Encounter (Signed)
Please review-aa 

## 2016-08-09 NOTE — Telephone Encounter (Signed)
[id number]-aa 

## 2016-08-09 NOTE — Telephone Encounter (Signed)
Everything plus meloxicam can cause dizziness.

## 2016-08-09 NOTE — Telephone Encounter (Signed)
Patient advised.

## 2016-08-09 NOTE — Telephone Encounter (Signed)
Pt returned Ana's call. Thanks TNP °

## 2016-09-04 ENCOUNTER — Telehealth: Payer: Self-pay | Admitting: Family Medicine

## 2016-09-04 NOTE — Telephone Encounter (Signed)
Patient advised, patient states he has refills but insurance not covering this medication for some reason. Patient advised before making any changes or any refills he will need to see [place] before anything gets approved. Patient Mathew Hill

## 2016-09-04 NOTE — Telephone Encounter (Signed)
We have no control over what insurance covers. It is absolutely time for patient to have a visit. It is been almost a year and a half and I will not refill any more medications without him being seen here

## 2016-09-04 NOTE — Telephone Encounter (Signed)
Pt needs refill on Venlafaxine XR 75mg .    Pt said [facility] told him his insurance would not pay for it but he's been getting it for years.  Pt call back is [phone]  Thanks Con Memos

## 2016-09-04 NOTE — Telephone Encounter (Signed)
Please review

## 2016-09-15 ENCOUNTER — Telehealth: Payer: Self-pay | Admitting: Family Medicine

## 2016-09-15 ENCOUNTER — Other Ambulatory Visit: Payer: Self-pay | Admitting: Family Medicine

## 2016-09-15 DIAGNOSIS — M109 Gout, unspecified: Secondary | ICD-10-CM

## 2016-09-15 MED ORDER — INDOMETHACIN 50 MG PO CAPS: 50.0000 mg | ORAL_CAPSULE | Freq: Three times a day (TID) | ORAL | 1 refills | Status: DC | PRN

## 2016-09-15 NOTE — Telephone Encounter (Signed)
Medication sent in. 

## 2016-09-15 NOTE — Telephone Encounter (Signed)
Please review. KW 

## 2016-09-15 NOTE — Telephone Encounter (Signed)
Pt contacted office for refill request on the following medications:  indomethacin (INDOCIN) 50 MG capsule.  [place]  CB#[phone]/MW  Pt states he is having gout pain in his little toe on his right foot.  Pt states he is working today and can not come in/MW

## 2017-02-12 ENCOUNTER — Encounter: Payer: Self-pay | Admitting: Family Medicine

## 2017-02-12 ENCOUNTER — Telehealth: Payer: Self-pay | Admitting: Family Medicine

## 2017-02-12 ENCOUNTER — Ambulatory Visit (INDEPENDENT_AMBULATORY_CARE_PROVIDER_SITE_OTHER): Payer: Self-pay | Admitting: Family Medicine

## 2017-02-12 VITALS — BP 134/80 | HR 72 | Temp 97.7°F | Resp 16 | Wt 275.0 lb

## 2017-02-12 DIAGNOSIS — F329 Major depressive disorder, single episode, unspecified: Secondary | ICD-10-CM

## 2017-02-12 DIAGNOSIS — K219 Gastro-esophageal reflux disease without esophagitis: Secondary | ICD-10-CM

## 2017-02-12 DIAGNOSIS — F419 Anxiety disorder, unspecified: Secondary | ICD-10-CM

## 2017-02-12 DIAGNOSIS — M109 Gout, unspecified: Secondary | ICD-10-CM

## 2017-02-12 DIAGNOSIS — Z720 Tobacco use: Secondary | ICD-10-CM

## 2017-02-12 DIAGNOSIS — F32A Depression, unspecified: Secondary | ICD-10-CM

## 2017-02-12 DIAGNOSIS — E7800 Pure hypercholesterolemia, unspecified: Secondary | ICD-10-CM

## 2017-02-12 DIAGNOSIS — E118 Type 2 diabetes mellitus with unspecified complications: Secondary | ICD-10-CM

## 2017-02-12 MED ORDER — SIMVASTATIN 40 MG PO TABS: 40.0000 mg | ORAL_TABLET | Freq: Every day | ORAL | 11 refills | Status: DC

## 2017-02-12 MED ORDER — ALPRAZOLAM 1 MG PO TABS: 1.0000 mg | ORAL_TABLET | Freq: Every evening | ORAL | 5 refills | Status: DC | PRN

## 2017-02-12 MED ORDER — OMEPRAZOLE 40 MG PO CPDR: 40.0000 mg | DELAYED_RELEASE_CAPSULE | Freq: Every day | ORAL | 11 refills | Status: DC

## 2017-02-12 MED ORDER — VENLAFAXINE HCL ER 75 MG PO CP24: 225.0000 mg | ORAL_CAPSULE | Freq: Every day | ORAL | 11 refills | Status: DC

## 2017-02-12 NOTE — Telephone Encounter (Signed)
Pt requesting appetite suppressant called into [facility] on [address].

## 2017-02-12 NOTE — Telephone Encounter (Signed)
He mentioned this and I forgot to cover it. This is not a good idea at his age. Better to work on portion control. He also discussed other exercise other than weightbearing.

## 2017-02-12 NOTE — Telephone Encounter (Signed)
Was this something discussed during visit today.

## 2017-02-12 NOTE — Progress Notes (Signed)
Subjective:  HPI  Diabetes Mellitus Type II, Follow-up:   Lab Results  Component Value Date   HGBA1C 7.1 (A) 02/22/2015    Last seen for diabetes over 1 years ago.  Management since then includes none. He reports poor compliance with treatment. He is not having side effects.    Most Recent Eye Exam: 02/09/17 Current exercise: yard work no formal exercise.   Pertinent Labs:    Component Value Date/Time   CHOL 145 02/22/2015   TRIG 480 (A) 02/22/2015   HDL 23 (A) 02/22/2015   CREATININE 1.60 (H) 04/14/2015 1910    Wt Readings from Last 3 Encounters:  02/12/17 275 lb (124.7 kg)  04/20/15 280 lb (127 kg)  04/14/15 280 lb (127 kg)   ------------------------------------------------------------------------  Pt is due for all labs. He needs refills on all his medications. He also would like to know if he could an appetite suppressant so that he could lose some weight. He has not been taking his medications because he has been out and could not get them refilled until he was seen. He reports that both knees are hurting and has been told that it was just arthritis but he says the pain did not start until he feel out of a trailer.   Anxiety/Depression- Pt reports that he has only been taking Venlafaxine 75 mg two daily instead of three because he was running out of medication. He reports that he is doing ok, He gets the "jittery" feeling sometimes and when he had the xanax he would take that and would help. He also reports that he can not fall asleep.    Prior to Admission medications   Medication Sig Start Date End Date Taking? Authorizing Provider  ALPRAZolam Duanne Moron) 1 MG tablet Take by mouth. 02/22/15   Historical Provider, MD  Cholecalciferol (VITAMIN D3) 5000 UNITS TABS Take by mouth.    Historical Provider, MD  Garcinia Cambogia-Chromium 500-200 MG-MCG TABS Take by mouth.    Historical Provider, MD  ibuprofen (ADVIL,MOTRIN) 600 MG tablet Take 1 tablet (600 mg total) by  mouth every 6 (six) hours as needed for moderate pain. 04/13/16   Melony Overly, MD  indomethacin (INDOCIN) 50 MG capsule Take 1 capsule (50 mg total) by mouth 3 (three) times daily as needed. 09/15/16   Carmon Ginsberg, PA  omeprazole (PRILOSEC) 40 MG capsule Take 1 capsule (40 mg total) by mouth daily. 06/26/16   Richard Maceo Pro., MD  predniSONE (DELTASONE) 50 MG tablet Take 1 pill daily for 5 days. 04/13/16   Melony Overly, MD  simvastatin (ZOCOR) 40 MG tablet Take 1 tablet (40 mg total) by mouth daily. 06/26/16   Jerrol Banana., MD  Testosterone (ANDROGEL PUMP) 20.25 MG/ACT (1.62%) GEL 2 pumps under each daily 06/22/16   Jerrol Banana., MD  venlafaxine XR (EFFEXOR-XR) 75 MG 24 hr capsule Take 3 capsules (225 mg total) by mouth daily. 06/26/16   Jerrol Banana., MD    Patient Active Problem List   Diagnosis Date Noted  . Anxiety 03/28/2015  . Arthritis 03/28/2015  . Clinical depression 03/28/2015  . Diabetes ([place]) 03/28/2015  . Acid reflux 03/28/2015  . Gout 03/28/2015  . HLD (hyperlipidemia) 03/28/2015  . Adiposity 03/28/2015  . Hypercholesterolemia without hypertriglyceridemia 03/28/2015  . Hypotestosteronism 03/28/2015  . Current tobacco use 03/28/2015  . Avitaminosis D 03/28/2015    History reviewed. No pertinent past medical history.  Social History   Social History  .  Marital status: Married    Spouse name: N/A  . Number of children: N/A  . Years of education: N/A   Occupational History  . Not on file.   Social History Main Topics  . Smoking status: Current Every Day Smoker    Packs/day: 0.50    Years: 45.00    Types: Cigarettes  . Smokeless tobacco: Never Used     Comment: he quit but has started back but is not smoking as much.   . Alcohol use 0.0 oz/week     Comment: seldom  . Drug use: No  . Sexual activity: Not on file   Other Topics Concern  . Not on file   Social History Narrative  . No narrative on file    Allergies  Allergen  Reactions  . Codeine     GI Upset    Review of Systems  Constitutional: Negative.   HENT: Negative.   Eyes: Negative.   Respiratory: Negative.   Cardiovascular: Negative.   Gastrointestinal: Negative.   Genitourinary: Negative.   Musculoskeletal: Positive for joint pain.  Skin: Negative.   Neurological: Negative.   Endo/Heme/Allergies: Negative.   Psychiatric/Behavioral: Negative.     Immunization History  Administered Date(s) Administered  . Td 11/06/2003  . Tdap 02/22/2015    Objective:  BP 134/80 (BP Location: Left Arm, Patient Position: Sitting, Cuff Size: Large)   Pulse 72   Temp 97.7 F (36.5 C) (Oral)   Resp 16   Wt 275 lb (124.7 kg)   SpO2 98%   BMI 38.35 kg/m   Depression screen [facility] 2/9 02/12/2017  Decreased Interest 0  Down, Depressed, Hopeless 0  PHQ - 2 Score 0  Altered sleeping 1  Tired, decreased energy 1  Change in appetite 1  Feeling bad or failure about yourself  0  Trouble concentrating 0  Moving slowly or fidgety/restless 0  Suicidal thoughts 0  PHQ-9 Score 3     Physical Exam  Constitutional: He is oriented to person, place, and time and well-developed, well-nourished, and in no distress.  Eyes: Conjunctivae and EOM are normal. Pupils are equal, round, and reactive to light.  Neck: Normal range of motion. Neck supple.  Cardiovascular: Normal rate, regular rhythm, normal heart sounds and intact distal pulses.   Pulmonary/Chest: Effort normal and breath sounds normal.  Abdominal: Soft. Bowel sounds are normal.  Musculoskeletal: Normal range of motion.  Neurological: He is alert and oriented to person, place, and time. He has normal reflexes. Gait normal. GCS score is 15.  Skin: Skin is warm and dry.  Psychiatric: Mood, memory, affect and judgment normal.     Diabetic Foot Exam - Simple   Simple Foot Form Diabetic Foot exam was performed with the following findings:  Yes 02/12/2017  9:11 AM  Visual Inspection No deformities, no  ulcerations, no other skin breakdown bilaterally:  Yes Sensation Testing Intact to touch and monofilament testing bilaterally:  Yes Pulse Check Posterior Tibialis and Dorsalis pulse intact bilaterally:  Yes Comments Decreased sensation with tuning fork      Lab Results  Component Value Date   WBC 11.8 (H) 04/14/2015   HGB 13.7 04/14/2015   HCT 40.5 04/14/2015   PLT 224 04/14/2015   GLUCOSE 220 (H) 04/14/2015   CHOL 145 02/22/2015   TRIG 480 (A) 02/22/2015   HDL 23 (A) 02/22/2015   TSH 1.42 02/22/2015   PSA 1.4 02/22/2015   HGBA1C 7.1 (A) 02/22/2015    CMP     Component  Value Date/Time   NA 135 04/14/2015 1910   NA 141 02/22/2015   K 3.5 04/14/2015 1910   CL 100 (L) 04/14/2015 1910   CO2 24 04/14/2015 1910   GLUCOSE 220 (H) 04/14/2015 1910   BUN 17 04/14/2015 1910   BUN 13 02/22/2015   CREATININE 1.60 (H) 04/14/2015 1910   CALCIUM 8.8 (L) 04/14/2015 1910   PROT 8.0 04/14/2015 1910   ALBUMIN 3.7 04/14/2015 1910   AST 19 04/14/2015 1910   ALT 18 04/14/2015 1910   ALKPHOS 74 04/14/2015 1910   BILITOT 0.6 04/14/2015 1910   GFRNONAA 45 (L) 04/14/2015 1910   GFRAA 52 (L) 04/14/2015 1910    Assessment and Plan :  1. Type 2 diabetes mellitus with complication, unspecified whether long term insulin use (HCC)  - Hemoglobin A1c  2. Anxiety  - TSH - ALPRAZolam (XANAX) 1 MG tablet; Take 1 tablet (1 mg total) by mouth at bedtime as needed for anxiety.  Dispense: 30 tablet; Refill: 5  3. Depression, unspecified depression type  - venlafaxine XR (EFFEXOR-XR) 75 MG 24 hr capsule; Take 3 capsules (225 mg total) by mouth daily.  Dispense: 90 capsule; Refill: 11  4. Hypercholesterolemia without hypertriglyceridemia  - Lipid Panel With LDL/HDL Ratio - Comprehensive metabolic panel - simvastatin (ZOCOR) 40 MG tablet; Take 1 tablet (40 mg total) by mouth daily.  Dispense: 30 tablet; Refill: 11  5. Current tobacco use   6. Gout, unspecified cause, unspecified  chronicity, unspecified site  - Uric acid  7. Gastroesophageal reflux disease, esophagitis presence not specified  - CBC with Differential/Platelet - omeprazole (PRILOSEC) 40 MG capsule; Take 1 capsule (40 mg total) by mouth daily.  Dispense: 30 capsule; Refill: 11 8.Noncompliance with follow up Despite multiple medical issues pt has not been seen in almost 2 years and that was a sick visit. Will not refill meds without OV in the future.   HPI, Exam, and A&P Transcribed under the direction and in the presence of Richard L. Cranford Mon, MD  Electronically Signed: Katina Dung, CMA I have done the exam and reviewed the above chart and it is accurate to the best of my knowledge. Development worker, community has been used in this note in any air is in the dictation or transcription are unintentional.  [facility] 02/12/2017 11:00 AM

## 2017-02-12 NOTE — Telephone Encounter (Signed)
Advised  ED 

## 2017-02-13 LAB — COMPREHENSIVE METABOLIC PANEL
ALT: 26 IU/L (ref 0–44)
AST: 23 IU/L (ref 0–40)
Albumin/Globulin Ratio: 1.8 (ref 1.2–2.2)
Albumin: 4.4 g/dL (ref 3.6–4.8)
Alkaline Phosphatase: 114 IU/L (ref 39–117)
BUN/Creatinine Ratio: 11 (ref 10–24)
BUN: 13 mg/dL (ref 8–27)
Bilirubin Total: 0.3 mg/dL (ref 0.0–1.2)
CALCIUM: 9.5 mg/dL (ref 8.6–10.2)
CHLORIDE: 106 mmol/L (ref 96–106)
CO2: 19 mmol/L (ref 18–29)
Creatinine, Ser: 1.14 mg/dL (ref 0.76–1.27)
GFR calc Af Amer: 79 mL/min/{1.73_m2} (ref >59)
GFR, EST NON AFRICAN AMERICAN: 68 mL/min/{1.73_m2} (ref >59)
Globulin, Total: 2.5 g/dL (ref 1.5–4.5)
Glucose: 138 mg/dL — ABNORMAL HIGH (ref 65–99)
Potassium: 5.4 mmol/L — ABNORMAL HIGH (ref 3.5–5.2)
SODIUM: 148 mmol/L — AB (ref 134–144)
Total Protein: 6.9 g/dL (ref 6.0–8.5)

## 2017-02-13 LAB — LIPID PANEL WITH LDL/HDL RATIO
Cholesterol, Total: 150 mg/dL (ref 100–199)
HDL: 26 mg/dL — AB (ref >39)
LDL CALC: 72 mg/dL (ref 0–99)
LDl/HDL Ratio: 2.8 ratio (ref 0.0–3.6)
Triglycerides: 261 mg/dL — ABNORMAL HIGH (ref 0–149)
VLDL Cholesterol Cal: 52 mg/dL — ABNORMAL HIGH (ref 5–40)

## 2017-02-13 LAB — CBC WITH DIFFERENTIAL/PLATELET
BASOS: 1 %
Basophils Absolute: 0.1 10*3/uL (ref 0.0–0.2)
EOS (ABSOLUTE): 0.4 10*3/uL (ref 0.0–0.4)
Eos: 4 %
Hematocrit: 44.5 % (ref 37.5–51.0)
Hemoglobin: 14.9 g/dL (ref 13.0–17.7)
IMMATURE GRANS (ABS): 0 10*3/uL (ref 0.0–0.1)
Immature Granulocytes: 0 %
Lymphocytes Absolute: 2.5 10*3/uL (ref 0.7–3.1)
Lymphs: 26 %
MCH: 30.6 pg (ref 26.6–33.0)
MCHC: 33.5 g/dL (ref 31.5–35.7)
MCV: 91 fL (ref 79–97)
Monocytes Absolute: 0.5 10*3/uL (ref 0.1–0.9)
Monocytes: 5 %
NEUTROS ABS: 6.1 10*3/uL (ref 1.4–7.0)
Neutrophils: 64 %
PLATELETS: 269 10*3/uL (ref 150–379)
RBC: 4.87 x10E6/uL (ref 4.14–5.80)
RDW: 14.5 % (ref 12.3–15.4)
WBC: 9.5 10*3/uL (ref 3.4–10.8)

## 2017-02-13 LAB — HEMOGLOBIN A1C
Est. average glucose Bld gHb Est-mCnc: 148 mg/dL
Hgb A1c MFr Bld: 6.8 % — ABNORMAL HIGH (ref 4.8–5.6)

## 2017-02-13 LAB — TSH: TSH: 1.24 u[IU]/mL (ref 0.450–4.500)

## 2017-02-13 LAB — URIC ACID: URIC ACID: 8.1 mg/dL (ref 3.7–8.6)

## 2017-02-15 NOTE — Progress Notes (Signed)
Advised  ED 

## 2017-02-19 ENCOUNTER — Other Ambulatory Visit: Payer: Self-pay

## 2017-03-06 ENCOUNTER — Other Ambulatory Visit: Payer: Self-pay | Admitting: Family Medicine

## 2017-03-06 DIAGNOSIS — M109 Gout, unspecified: Secondary | ICD-10-CM

## 2017-03-06 NOTE — Telephone Encounter (Signed)
Pt called requesting a refill on the following medication. Pt states Pharmacy will be faxing over a request for the medication. Thanks CC   indomethacin (INDOCIN) 50 MG capsule

## 2017-03-08 MED ORDER — INDOMETHACIN 50 MG PO CAPS: 50.0000 mg | ORAL_CAPSULE | Freq: Three times a day (TID) | ORAL | 5 refills | Status: DC | PRN

## 2017-03-09 ENCOUNTER — Other Ambulatory Visit: Payer: Self-pay | Admitting: Emergency Medicine

## 2017-03-09 DIAGNOSIS — M109 Gout, unspecified: Secondary | ICD-10-CM

## 2017-03-09 MED ORDER — INDOMETHACIN 50 MG PO CAPS: 50.0000 mg | ORAL_CAPSULE | Freq: Three times a day (TID) | ORAL | 1 refills | Status: DC | PRN

## 2017-03-16 LAB — HM DIABETES EYE EXAM

## 2017-03-27 ENCOUNTER — Telehealth: Payer: Self-pay | Admitting: Family Medicine

## 2017-03-27 NOTE — Telephone Encounter (Signed)
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  Due to medications he takes.  hE went to get a DOT and they wouldn't do one for him.  I explained Dr. Darnell Level does not do DOT's.  Pt's call back [phone]  Laurelyn Sickle,

## 2017-03-27 NOTE — Telephone Encounter (Signed)
That is not done by DOT Doc but by private doc. That requires a separate visit here to review in depth DM and DOT issues. I do not have any appt until June 11 or later.

## 2017-03-27 NOTE — Telephone Encounter (Signed)
Please review request for note-aa

## 2017-03-28 NOTE — Telephone Encounter (Signed)
Pt called to see if letter was ready. I advised pt of Dr. Alben Spittle reply. Pt stated he spoke to someone yesterday afternoon and was told the letter would be ready this afternoon since Dr. Rosanna Randy is going out of town. I spoke with Dr. Alben Spittle nurse [place] and was advised that the letter was ready for pick up. Pt was advised and stated he would be here today or tomorrow to pick up the letter. Thanks TNP

## 2017-03-30 LAB — HM DIABETES EYE EXAM

## 2017-04-05 ENCOUNTER — Encounter: Payer: Self-pay | Admitting: Emergency Medicine

## 2017-05-28 ENCOUNTER — Ambulatory Visit (INDEPENDENT_AMBULATORY_CARE_PROVIDER_SITE_OTHER): Payer: Self-pay | Admitting: Family Medicine

## 2017-05-28 ENCOUNTER — Encounter: Payer: Self-pay | Admitting: Family Medicine

## 2017-05-28 VITALS — BP 142/94 | HR 69 | Temp 98.2°F | Resp 16 | Wt 262.8 lb

## 2017-05-28 DIAGNOSIS — L304 Erythema intertrigo: Secondary | ICD-10-CM

## 2017-05-28 MED ORDER — FLUOCINONIDE 0.05 % EX CREA: 1.0000 "application " | TOPICAL_CREAM | Freq: Two times a day (BID) | CUTANEOUS | 0 refills | Status: DC

## 2017-05-28 NOTE — Patient Instructions (Signed)
Powder the back of your legs after showering and before going to work.

## 2017-05-28 NOTE — Progress Notes (Signed)
Subjective:     Patient ID: Mathew Hill, male   DOB: [birth year], 66 y.o.   MRN: [id number]  HPI  Chief Complaint  Patient presents with  . Rash    Patient comes in office today with concerns of possible rash on the back of his legs for the past week. 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 Patient reports applying Neosporin to skin but states that rash now seems to be spreading.   States he wears jeans and sweats in his knee crease. Left side is more affected than right as the right leg is extended more to operate the motor vehicle.   Review of Systems     Objective:   Physical Exam  Constitutional: He appears well-developed and well-nourished. No distress.  Skin:  Posterior knees (L > R) with erythematous rash and early lichenification along skin lines.       Assessment:    1. Intertrigo: due to mechanical irritation from clothing and sweat. - fluocinonide cream (LIDEX) 0.05 %; Apply 1 application topically 2 (two) times daily.  Dispense: 15 g; Refill: 0    Plan:    Powder behind knees after shower and before work.

## 2017-08-20 ENCOUNTER — Ambulatory Visit (INDEPENDENT_AMBULATORY_CARE_PROVIDER_SITE_OTHER): Payer: PRIVATE HEALTH INSURANCE | Admitting: Family Medicine

## 2017-08-20 VITALS — BP 130/78 | HR 68 | Temp 97.8°F | Resp 16 | Wt 268.0 lb

## 2017-08-20 DIAGNOSIS — E118 Type 2 diabetes mellitus with unspecified complications: Secondary | ICD-10-CM | POA: Diagnosis not present

## 2017-08-20 DIAGNOSIS — F419 Anxiety disorder, unspecified: Secondary | ICD-10-CM

## 2017-08-20 DIAGNOSIS — Z1159 Encounter for screening for other viral diseases: Secondary | ICD-10-CM

## 2017-08-20 DIAGNOSIS — L309 Dermatitis, unspecified: Secondary | ICD-10-CM

## 2017-08-20 DIAGNOSIS — Z1211 Encounter for screening for malignant neoplasm of colon: Secondary | ICD-10-CM | POA: Diagnosis not present

## 2017-08-20 DIAGNOSIS — E7800 Pure hypercholesterolemia, unspecified: Secondary | ICD-10-CM | POA: Diagnosis not present

## 2017-08-20 DIAGNOSIS — K219 Gastro-esophageal reflux disease without esophagitis: Secondary | ICD-10-CM | POA: Diagnosis not present

## 2017-08-20 DIAGNOSIS — Z23 Encounter for immunization: Secondary | ICD-10-CM

## 2017-08-20 DIAGNOSIS — F32A Depression, unspecified: Secondary | ICD-10-CM

## 2017-08-20 DIAGNOSIS — F329 Major depressive disorder, single episode, unspecified: Secondary | ICD-10-CM | POA: Diagnosis not present

## 2017-08-20 LAB — POCT GLYCOSYLATED HEMOGLOBIN (HGB A1C): Hemoglobin A1C: 6.1

## 2017-08-20 MED ORDER — OMEPRAZOLE 40 MG PO CPDR: 40.0000 mg | DELAYED_RELEASE_CAPSULE | Freq: Every day | ORAL | 3 refills | Status: DC

## 2017-08-20 MED ORDER — ALPRAZOLAM 1 MG PO TABS
1.0000 mg | ORAL_TABLET | Freq: Every evening | ORAL | 5 refills | Status: DC | PRN
Start: 2017-08-20 — End: 2018-02-25

## 2017-08-20 MED ORDER — VENLAFAXINE HCL ER 75 MG PO CP24: 225.0000 mg | ORAL_CAPSULE | Freq: Every day | ORAL | 3 refills | Status: DC

## 2017-08-20 MED ORDER — MOMETASONE FUROATE 0.1 % EX CREA: 1.0000 "application " | TOPICAL_CREAM | Freq: Every day | CUTANEOUS | 0 refills | Status: DC

## 2017-08-20 MED ORDER — SIMVASTATIN 40 MG PO TABS: 40.0000 mg | ORAL_TABLET | Freq: Every day | ORAL | 3 refills | Status: DC

## 2017-08-20 NOTE — Progress Notes (Signed)
Mathew Hill  MRN: [id number] DOB: 04/01/1951  Subjective:  HPI   The patient is a 66 year old male who presents for follow up of his diabetes.  He was last seen in July for an acute visit with one of the PA's.  His last chronic disease visit was on 02/12/17.  At that time his A1C was 6.8.  He does not check his glucose at home.  The patient is due for his micro-albumin.    He is up to date on his foot and eye exam.   The patient is due for his colonoscopy, Hepatitis C screening, Pneumovax and Flu vaccine.  However he states that his insurance does not pay for any preventative medicine.    The patient is also here for follow up of his depression/anxiety.  He states that he feels he can't tell any improvement on the Venlafaxine.  He states he does not have any energy and when he is not working he does not want to do anything.    He states that he has Xanax to help him sleep.  He takes it most nights and on occasion he has to take one during the day for feelings of jittery.  Patient Active Problem List   Diagnosis Date Noted  . Anxiety 03/28/2015  . Arthritis 03/28/2015  . Clinical depression 03/28/2015  . Diabetes ([place]) 03/28/2015  . Acid reflux 03/28/2015  . Gout 03/28/2015  . HLD (hyperlipidemia) 03/28/2015  . Adiposity 03/28/2015  . Hypercholesterolemia without hypertriglyceridemia 03/28/2015  . Hypotestosteronism 03/28/2015  . Current tobacco use 03/28/2015  . Avitaminosis D 03/28/2015    No past medical history on file.  Social History   Social History  . Marital status: Married    Spouse name: N/A  . Number of children: N/A  . Years of education: N/A   Occupational History  . Not on file.   Social History Main Topics  . Smoking status: Current Every Day Smoker    Packs/day: 0.50    Years: 45.00    Types: Cigarettes  . Smokeless tobacco: Never Used     Comment: he quit but has started back but is not smoking as much.   . Alcohol use 0.0 oz/week   Comment: seldom  . Drug use: No  . Sexual activity: Not on file   Other Topics Concern  . Not on file   Social History Narrative  . No narrative on file    Outpatient Encounter Prescriptions as of 08/20/2017  Medication Sig Note  . ALPRAZolam (XANAX) 1 MG tablet Take 1 tablet (1 mg total) by mouth at bedtime as needed for anxiety.   . Cholecalciferol (VITAMIN D3) 5000 UNITS TABS Take by mouth. 03/28/2015: Received from: [facility]  . fluocinonide cream (LIDEX) 8.41 % Apply 1 application topically 2 (two) times daily.   Marland Kitchen ibuprofen (ADVIL,MOTRIN) 600 MG tablet Take 1 tablet (600 mg total) by mouth every 6 (six) hours as needed for moderate pain.   . indomethacin (INDOCIN) 50 MG capsule Take 1 capsule (50 mg total) by mouth 3 (three) times daily as needed.   Marland Kitchen omeprazole (PRILOSEC) 40 MG capsule Take 1 capsule (40 mg total) by mouth daily.   . simvastatin (ZOCOR) 40 MG tablet Take 1 tablet (40 mg total) by mouth daily.   . Testosterone (ANDROGEL PUMP) 20.25 MG/ACT (1.62%) GEL 2 pumps under each daily   . venlafaxine XR (EFFEXOR-XR) 75 MG 24 hr capsule Take 3 capsules (225 mg total)  by mouth daily.    No facility-administered encounter medications on file as of 08/20/2017.     Allergies  Allergen Reactions  . Codeine Other (See Comments)    GI Upset GI Upset GI Upset    Review of Systems  Constitutional: Negative for fever and malaise/fatigue.  Eyes: Negative.   Respiratory: Negative for cough, shortness of breath and wheezing.   Cardiovascular: Positive for leg swelling (ankle swelling in the evenings.  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 Negative for chest pain and palpitations.  Gastrointestinal: Negative.   Musculoskeletal: Negative.   Skin: Positive for rash.  Neurological: Negative.  Negative for weakness.  Endo/Heme/Allergies: Negative.   Psychiatric/Behavioral: The patient has insomnia (Controlled with Xanax).       Objective:  BP 130/78 (BP Location: Right Arm, Patient Position: Sitting, Cuff Size: Normal)   Pulse 68   Temp 97.8 F (36.6 C) (Oral)   Resp 16   Wt 268 lb (121.6 kg)   BMI 37.38 kg/m   Physical Exam  Constitutional: He is oriented to person, place, and time and well-developed, well-nourished, and in no distress.  HENT:  Head: Normocephalic and atraumatic.  Right Ear: External ear normal.  Left Ear: External ear normal.  Nose: Nose normal.  Eyes: Pupils are equal, round, and reactive to light. Conjunctivae are normal.  Neck: Normal range of motion. No thyromegaly present.  Cardiovascular: Normal rate, regular rhythm and normal heart sounds.   Pulmonary/Chest: Effort normal and breath sounds normal.  Abdominal: Soft.  Musculoskeletal: He exhibits no edema.  Neurological: He is alert and oriented to person, place, and time. Gait normal. GCS score is 15.  Skin: Skin is warm and dry.  Baseball size eczematous rash behind the left knee  Psychiatric: Mood, memory, affect and judgment normal.    Assessment and Plan :   1. Type 2 diabetes mellitus with complication, unspecified whether long term insulin use (HCC) A1C today 6.1. Excellent control. - POCT glycosylated hemoglobin (Hb A1C)  2. Need for influenza vaccination  - Flu Vaccine QUAD 6+ mos PF IM (Fluarix Quad PF)  3. Colon cancer screening Patient declines, states his insurance will not pay for preventative medicine  4. Encounter for hepatitis C screening test for low risk patient Patient declines, states his insurance will not cover preventative medicine 5.Eczema  Topical mometasone daily for a few weeks. 6.GAD 7.Hypogonadism  I have done the exam and reviewed the chart and it is accurate to the best of my knowledge. Development worker, community has been used and  any errors in dictation or transcription are unintentional. Miguel Aschoff M.D. [facility]

## 2018-01-03 ENCOUNTER — Other Ambulatory Visit: Payer: Self-pay | Admitting: Family Medicine

## 2018-01-03 NOTE — Telephone Encounter (Signed)
Pt requesting refill. Please advise. Thanks.  

## 2018-01-03 NOTE — Telephone Encounter (Signed)
[facility] faxed a refill request for the following medication. Thanks CC  Testosterone (ANDROGEL PUMP) 20.25 MG/ACT (1.62%) GEL

## 2018-01-04 MED ORDER — TESTOSTERONE 20.25 MG/ACT (1.62%) TD GEL: TRANSDERMAL | 3 refills | Status: DC

## 2018-01-10 IMAGING — DX DG KNEE COMPLETE 4+V*L*
4 series · 4 of 4 positions shown · non-contrast
Comparison: MRI of the left knee of 12/09/2010

CLINICAL DATA: Fell yesterday with pain and swelling in the left
knee, difficulty bearing weight

EXAM:
LEFT KNEE - COMPLETE 4+ VIEW

[knee ap]
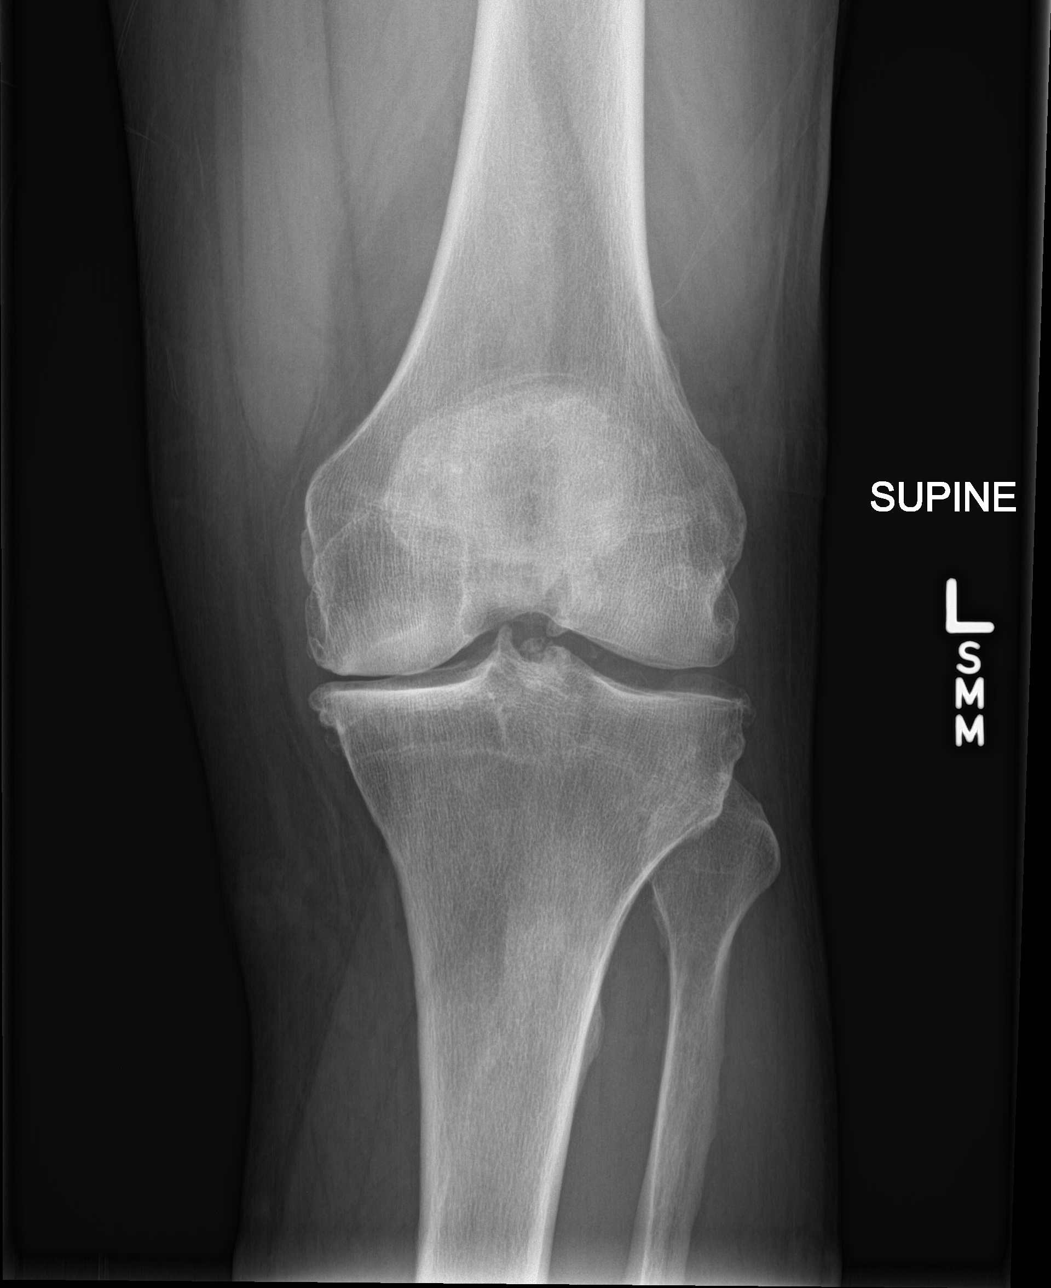

[knee obl (1 of 2)]
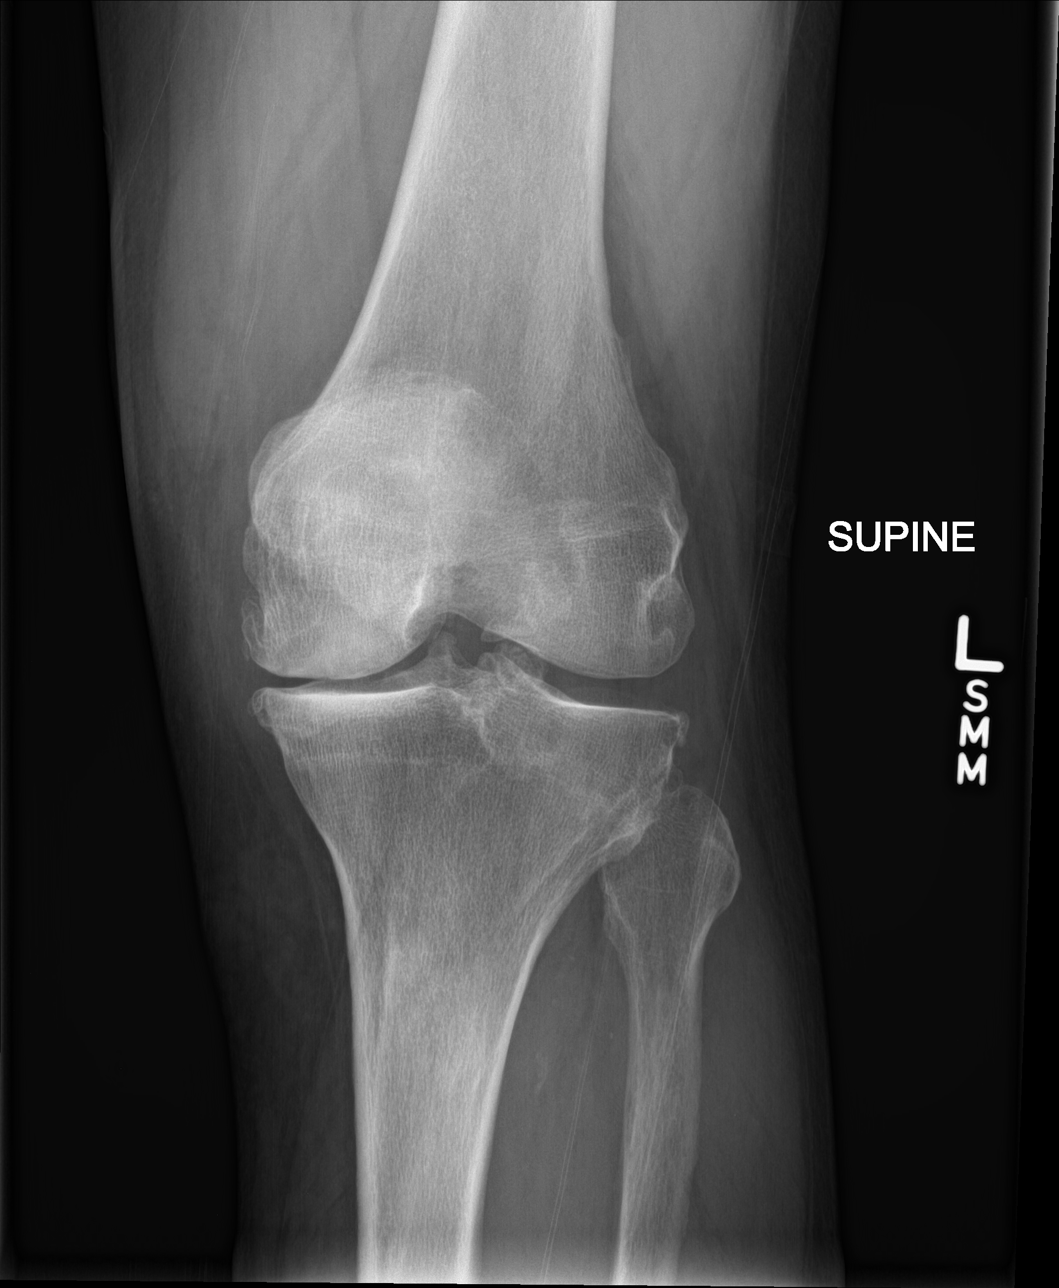

[knee obl (2 of 2)]
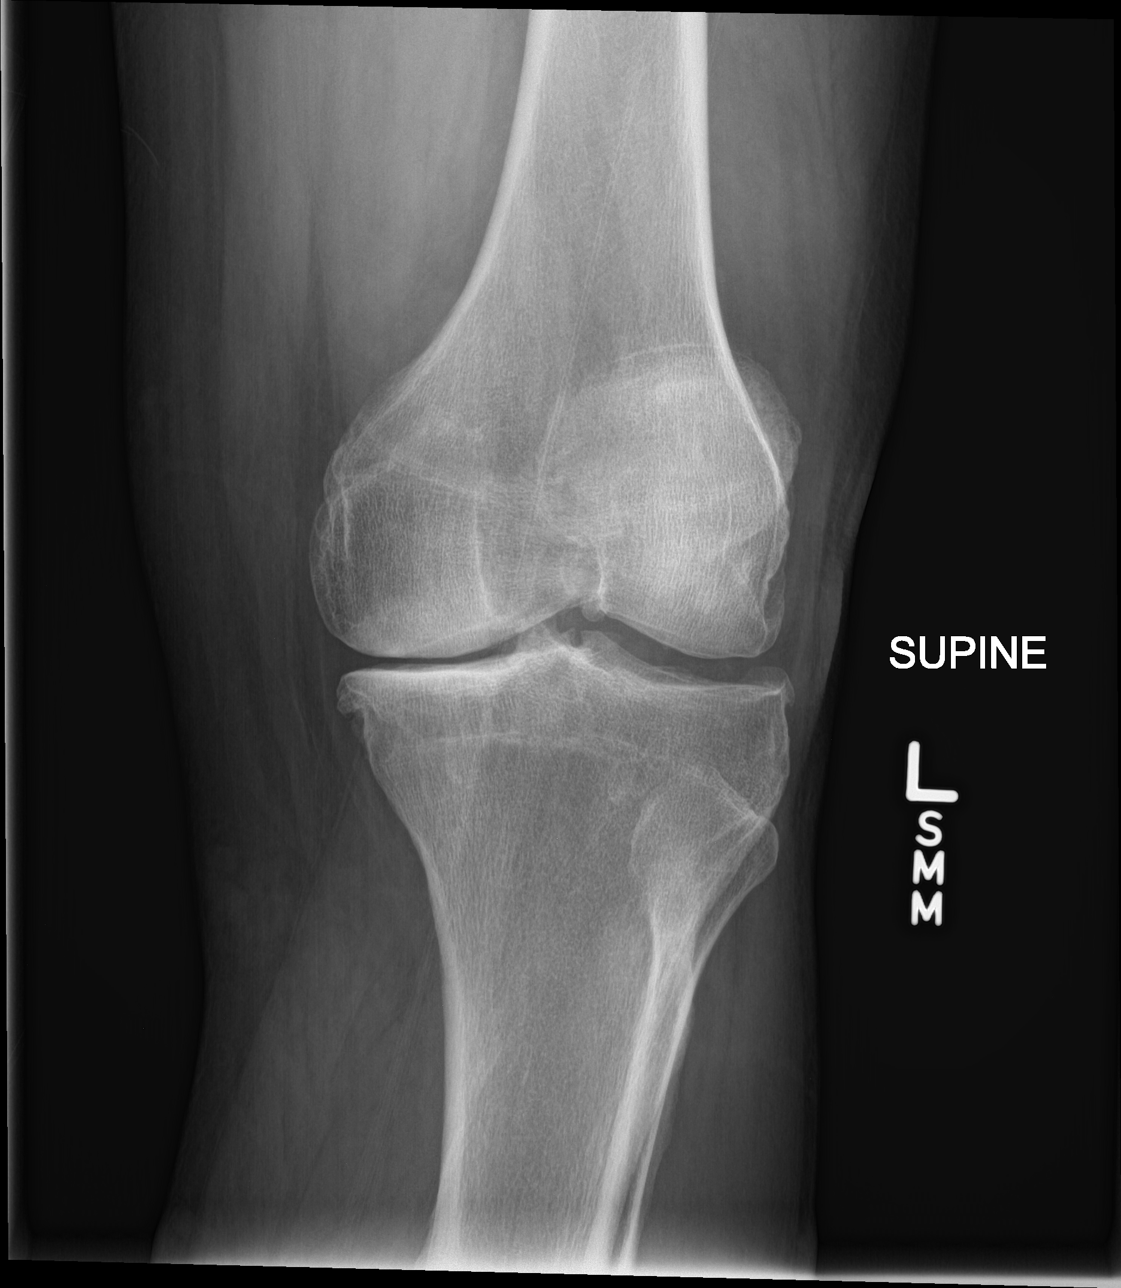

[knee lat]
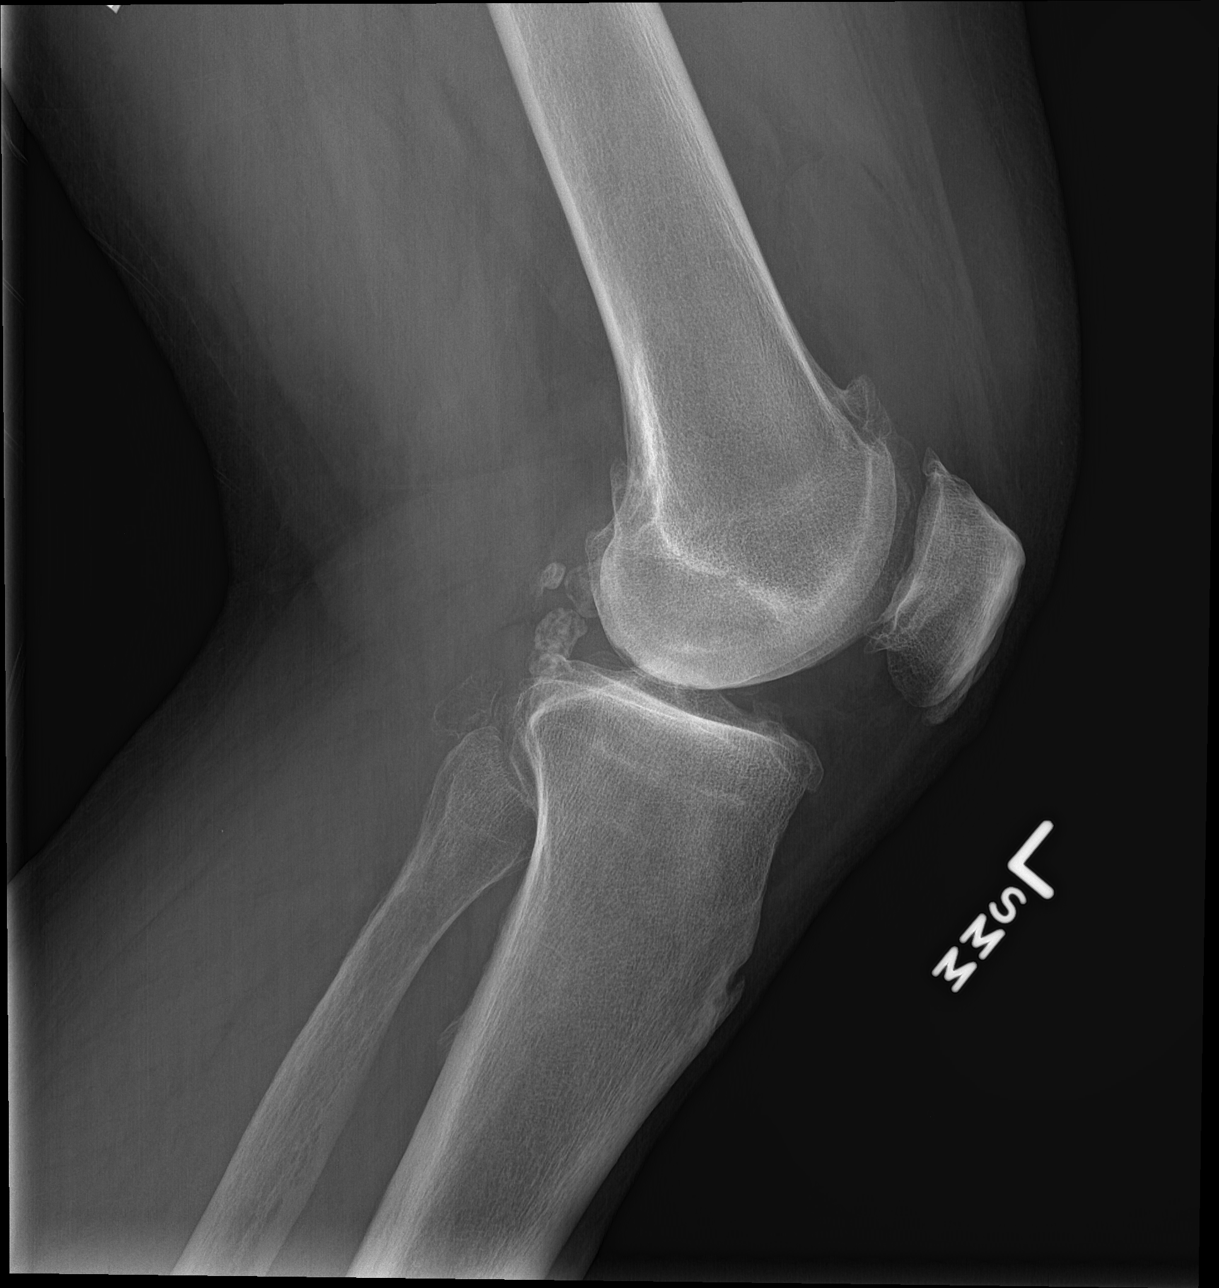

[4 of 4 positions shown; findings below may reference images not displayed]

FINDINGS: There is tricompartmental degenerative joint disease of the left
knee primarily involving the medial compartment where there is
considerable loss of joint space and sclerosis with spurring. No
acute fracture is seen, but there is a moderate size left knee joint
effusion present. Also there appear to be loose bodies in the
posterior joint space.
IMPRESSION: 1. Tricompartmental degenerative joint disease left knee primarily
involving the medial compartment.
2. Moderate size left knee joint effusion.
3. Loose bodies in the posterior joint space.

## 2018-01-15 ENCOUNTER — Telehealth: Payer: Self-pay | Admitting: Family Medicine

## 2018-02-18 ENCOUNTER — Ambulatory Visit: Payer: Self-pay | Admitting: Family Medicine

## 2018-02-25 ENCOUNTER — Other Ambulatory Visit: Payer: Self-pay

## 2018-02-25 ENCOUNTER — Encounter: Payer: Self-pay | Admitting: Family Medicine

## 2018-02-25 ENCOUNTER — Ambulatory Visit: Payer: Self-pay | Admitting: Family Medicine

## 2018-02-25 ENCOUNTER — Telehealth: Payer: Self-pay | Admitting: Family Medicine

## 2018-02-25 VITALS — BP 122/68 | HR 76 | Temp 98.7°F | Resp 16 | Ht 71.0 in | Wt 263.0 lb

## 2018-02-25 DIAGNOSIS — F32A Depression, unspecified: Secondary | ICD-10-CM

## 2018-02-25 DIAGNOSIS — E118 Type 2 diabetes mellitus with unspecified complications: Secondary | ICD-10-CM

## 2018-02-25 DIAGNOSIS — E349 Endocrine disorder, unspecified: Secondary | ICD-10-CM

## 2018-02-25 DIAGNOSIS — F419 Anxiety disorder, unspecified: Secondary | ICD-10-CM

## 2018-02-25 DIAGNOSIS — K219 Gastro-esophageal reflux disease without esophagitis: Secondary | ICD-10-CM

## 2018-02-25 DIAGNOSIS — F329 Major depressive disorder, single episode, unspecified: Secondary | ICD-10-CM

## 2018-02-25 DIAGNOSIS — E7800 Pure hypercholesterolemia, unspecified: Secondary | ICD-10-CM

## 2018-02-25 MED ORDER — VENLAFAXINE HCL ER 75 MG PO CP24: 225.0000 mg | ORAL_CAPSULE | Freq: Every day | ORAL | 1 refills | Status: DC

## 2018-02-25 MED ORDER — ALPRAZOLAM 1 MG PO TABS: 1.0000 mg | ORAL_TABLET | Freq: Every evening | ORAL | 1 refills | Status: DC | PRN

## 2018-02-25 MED ORDER — TESTOSTERONE 20.25 MG/ACT (1.62%) TD GEL: TRANSDERMAL | 3 refills | Status: DC

## 2018-02-25 MED ORDER — OMEPRAZOLE 40 MG PO CPDR: 40.0000 mg | DELAYED_RELEASE_CAPSULE | Freq: Every day | ORAL | 1 refills | Status: DC

## 2018-02-25 MED ORDER — SIMVASTATIN 40 MG PO TABS: 40.0000 mg | ORAL_TABLET | Freq: Every day | ORAL | 1 refills | Status: DC

## 2018-02-25 NOTE — Telephone Encounter (Signed)
error 

## 2018-02-25 NOTE — Progress Notes (Signed)
Patient: Mathew Hill Male    DOB: 01-29-1951   67 y.o.   MRN: [id number] Visit Date: 02/25/2018  Today's Provider: [name], MD   Chief Complaint  Patient presents with  . Diabetes  . Hypogonadism   Subjective:    HPI  Diabetes Mellitus Type II, Follow-up:   Lab Results  Component Value Date   HGBA1C 6.1 08/20/2017   HGBA1C 6.8 (H) 02/12/2017   HGBA1C 7.1 (A) 02/22/2015    Last seen for diabetes 6 months ago.  Management since then includes none. He reports good compliance with treatment. He is not having side effects.  Home blood sugar records: not being checked  Current Insulin Regimen: n/a Current exercise: none  Pertinent Labs:    Component Value Date/Time   CHOL 150 02/12/2017 0935   TRIG 261 (H) 02/12/2017 0935   HDL 26 (L) 02/12/2017 0935   LDLCALC 72 02/12/2017 0935   CREATININE 1.14 02/12/2017 0935    Wt Readings from Last 3 Encounters:  02/25/18 263 lb (119.3 kg)  08/20/17 268 lb (121.6 kg)  05/28/17 262 lb 12.8 oz (119.2 kg)    ------------------------------------------------------------------------  Pt also needs Testosterone levels checked for forms to be filled out for testosterone rx. Pt is due for labs. Pt has been having neck and knee pain. He has know arthritis in these areas and wants to know if there anything that he can do for this. He needs refills on his medications today. He would like to discuss a handicapped sticker.     Allergies  Allergen Reactions  . Codeine Other (See Comments)    GI Upset GI Upset GI Upset     Current Outpatient Medications:  .  ALPRAZolam (XANAX) 1 MG tablet, Take 1 tablet (1 mg total) by mouth at bedtime as needed for anxiety., Disp: 30 tablet, Rfl: 5 .  Cholecalciferol (VITAMIN D3) 5000 UNITS TABS, Take by mouth., Disp: , Rfl:  .  indomethacin (INDOCIN) 50 MG capsule, Take 1 capsule (50 mg total) by mouth 3 (three) times daily as needed., Disp: 270 capsule, Rfl: 1 .  omeprazole  (PRILOSEC) 40 MG capsule, Take 1 capsule (40 mg total) by mouth daily., Disp: 90 capsule, Rfl: 3 .  simvastatin (ZOCOR) 40 MG tablet, Take 1 tablet (40 mg total) by mouth daily., Disp: 90 tablet, Rfl: 3 .  Testosterone (ANDROGEL PUMP) 20.25 MG/ACT (1.62%) GEL, 2 pumps under each daily, Disp: 75 g, Rfl: 3 .  venlafaxine XR (EFFEXOR-XR) 75 MG 24 hr capsule, Take 3 capsules (225 mg total) by mouth daily., Disp: 270 capsule, Rfl: 3 .  fluocinonide cream (LIDEX) 0.62 %, Apply 1 application topically 2 (two) times daily., Disp: 15 g, Rfl: 0 .  ibuprofen (ADVIL,MOTRIN) 600 MG tablet, Take 1 tablet (600 mg total) by mouth every 6 (six) hours as needed for moderate pain. (Patient not taking: Reported on 02/25/2018), Disp: 30 tablet, Rfl: 0 .  mometasone (ELOCON) 0.1 % cream, Apply 1 application topically daily. (Patient not taking: Reported on 02/25/2018), Disp: 45 g, Rfl: 0  Review of Systems  Constitutional: Negative.   HENT: Negative.   Eyes: Negative.   Respiratory: Negative.   Cardiovascular: Negative.   Gastrointestinal: Negative.   Endocrine: Negative.   Genitourinary: Negative.   Musculoskeletal: Positive for arthralgias and neck pain.  Skin: Negative.   Allergic/Immunologic: Negative.   Neurological: Negative.   Hematological: Negative.   Psychiatric/Behavioral: Negative.     Social History  Tobacco Use  . Smoking status: Current Every Day Smoker    Packs/day: 0.50    Years: 45.00    Pack years: 22.50    Types: Cigarettes  . Smokeless tobacco: Never Used  . Tobacco comment: he quit but has started back but is not smoking as much.   Substance Use Topics  . Alcohol use: Yes    Alcohol/week: 0.0 oz    Comment: seldom   Objective:   BP 122/68 (BP Location: Left Arm, Patient Position: Sitting, Cuff Size: Large)   Pulse 76   Temp 98.7 F (37.1 C) (Oral)   Resp 16   Ht 5\' 11"  (1.803 m)   Wt 263 lb (119.3 kg)   BMI 36.68 kg/m  Vitals:   02/25/18 1340  BP: 122/68  Pulse: 76   Resp: 16  Temp: 98.7 F (37.1 C)  TempSrc: Oral  Weight: 263 lb (119.3 kg)  Height: 5\' 11"  (1.803 m)     Physical Exam  Constitutional: He is oriented to person, place, and time. He appears well-developed and well-nourished.  HENT:  Head: Normocephalic and atraumatic.  Right Ear: External ear normal.  Left Ear: External ear normal.  Mouth/Throat: Oropharynx is clear and moist.  Eyes: No scleral icterus.  Neck: No thyromegaly present.  Cardiovascular: Normal rate, regular rhythm and normal heart sounds.  Pulmonary/Chest: Effort normal and breath sounds normal.  Abdominal: Soft.  Lymphadenopathy:    He has no cervical adenopathy.  Neurological: He is alert and oriented to person, place, and time.  Skin: Skin is warm and dry.  Psychiatric: He has a normal mood and affect. His behavior is normal. Judgment and thought content normal.        Assessment & Plan:     1. Anxiety  - TSH  2. Type 2 diabetes mellitus with complication, unspecified whether long term insulin use (HCC)  - CBC with Differential/Platelet - Hemoglobin A1c  3. Depression, unspecified depression type  - venlafaxine XR (EFFEXOR-XR) 75 MG 24 hr capsule; Take 3 capsules (225 mg total) by mouth daily.  Dispense: 270 capsule; Refill: 1  4. Hypercholesterolemia without hypertriglyceridemia  - Lipid panel - Comprehensive metabolic panel - simvastatin (ZOCOR) 40 MG tablet; Take 1 tablet (40 mg total) by mouth daily.  Dispense: 90 tablet; Refill: 1  5. Hypotestosteronism Pt has not had level done since 2016. - Testosterone,Free and Total  6. Gastroesophageal reflux disease, esophagitis presence not specified  - omeprazole (PRILOSEC) 40 MG capsule; Take 1 capsule (40 mg total) by mouth daily.  Dispense: 90 capsule; Refill: 1       I have done the exam and reviewed the above chart and it is accurate to the best of my knowledge. Development worker, community has been used in this note in any air is in the  dictation or transcription are unintentional.  [name], MD  [place]

## 2018-02-27 LAB — CBC WITH DIFFERENTIAL/PLATELET
BASOS ABS: 0 10*3/uL (ref 0.0–0.2)
Basos: 0 %
EOS (ABSOLUTE): 0.3 10*3/uL (ref 0.0–0.4)
EOS: 3 %
HEMATOCRIT: 43.7 % (ref 37.5–51.0)
HEMOGLOBIN: 14.3 g/dL (ref 13.0–17.7)
IMMATURE GRANULOCYTES: 1 %
Immature Grans (Abs): 0 10*3/uL (ref 0.0–0.1)
Lymphocytes Absolute: 2.3 10*3/uL (ref 0.7–3.1)
Lymphs: 27 %
MCH: 29.9 pg (ref 26.6–33.0)
MCHC: 32.7 g/dL (ref 31.5–35.7)
MCV: 91 fL (ref 79–97)
MONOCYTES: 5 %
Monocytes Absolute: 0.4 10*3/uL (ref 0.1–0.9)
Neutrophils Absolute: 5.6 10*3/uL (ref 1.4–7.0)
Neutrophils: 64 %
Platelets: 284 10*3/uL (ref 150–379)
RBC: 4.79 x10E6/uL (ref 4.14–5.80)
RDW: 13.9 % (ref 12.3–15.4)
WBC: 8.7 10*3/uL (ref 3.4–10.8)

## 2018-02-27 LAB — TESTOSTERONE,FREE AND TOTAL
TESTOSTERONE FREE: 3.8 pg/mL — AB (ref 6.6–18.1)
Testosterone: 147 ng/dL — ABNORMAL LOW (ref 264–916)

## 2018-02-27 LAB — TSH: TSH: 1.03 u[IU]/mL (ref 0.450–4.500)

## 2018-02-27 LAB — COMPREHENSIVE METABOLIC PANEL
ALBUMIN: 4.5 g/dL (ref 3.6–4.8)
ALT: 19 IU/L (ref 0–44)
AST: 12 IU/L (ref 0–40)
Albumin/Globulin Ratio: 1.7 (ref 1.2–2.2)
Alkaline Phosphatase: 95 IU/L (ref 39–117)
BILIRUBIN TOTAL: 0.3 mg/dL (ref 0.0–1.2)
BUN/Creatinine Ratio: 12 (ref 10–24)
BUN: 14 mg/dL (ref 8–27)
CHLORIDE: 107 mmol/L — AB (ref 96–106)
CO2: 22 mmol/L (ref 20–29)
CREATININE: 1.16 mg/dL (ref 0.76–1.27)
Calcium: 9.5 mg/dL (ref 8.6–10.2)
GFR calc Af Amer: 77 mL/min/{1.73_m2} (ref >59)
GFR calc non Af Amer: 66 mL/min/{1.73_m2} (ref >59)
GLUCOSE: 90 mg/dL (ref 65–99)
Globulin, Total: 2.6 g/dL (ref 1.5–4.5)
POTASSIUM: 4.7 mmol/L (ref 3.5–5.2)
Sodium: 148 mmol/L — ABNORMAL HIGH (ref 134–144)
TOTAL PROTEIN: 7.1 g/dL (ref 6.0–8.5)

## 2018-02-27 LAB — HEMOGLOBIN A1C
ESTIMATED AVERAGE GLUCOSE: 134 mg/dL
Hgb A1c MFr Bld: 6.3 % — ABNORMAL HIGH (ref 4.8–5.6)

## 2018-02-27 LAB — LIPID PANEL
CHOL/HDL RATIO: 4.5 ratio (ref 0.0–5.0)
CHOLESTEROL TOTAL: 125 mg/dL (ref 100–199)
HDL: 28 mg/dL — AB (ref >39)
LDL Calculated: 64 mg/dL (ref 0–99)
Triglycerides: 164 mg/dL — ABNORMAL HIGH (ref 0–149)
VLDL Cholesterol Cal: 33 mg/dL (ref 5–40)

## 2018-03-04 ENCOUNTER — Telehealth: Payer: Self-pay | Admitting: Family Medicine

## 2018-03-04 ENCOUNTER — Other Ambulatory Visit: Payer: Self-pay

## 2018-03-04 DIAGNOSIS — E291 Testicular hypofunction: Secondary | ICD-10-CM

## 2018-03-04 NOTE — Telephone Encounter (Signed)
Opened in error. Pt found the Rx that he thought he lost for Testosterone (ANDROGEL PUMP) 20.25 MG/ACT (1.62%) GEL & ALPRAZolam (XANAX) 1 MG tablet and message was no longer needed. Thanks TNP

## 2018-03-05 NOTE — Progress Notes (Signed)
Advised  ED 

## 2018-03-09 LAB — PSA: Prostate Specific Ag, Serum: 1.4 ng/mL (ref 0.0–4.0)

## 2018-07-05 NOTE — Telephone Encounter (Signed)
error 

## 2018-08-26 ENCOUNTER — Ambulatory Visit: Payer: Self-pay | Admitting: Family Medicine

## 2018-08-29 ENCOUNTER — Other Ambulatory Visit: Payer: Self-pay | Admitting: Family Medicine

## 2018-08-29 DIAGNOSIS — F419 Anxiety disorder, unspecified: Secondary | ICD-10-CM

## 2018-11-04 ENCOUNTER — Ambulatory Visit (INDEPENDENT_AMBULATORY_CARE_PROVIDER_SITE_OTHER): Payer: Medicare Other | Admitting: Family Medicine

## 2018-11-04 ENCOUNTER — Encounter: Payer: Self-pay | Admitting: Family Medicine

## 2018-11-04 VITALS — BP 136/72 | HR 72 | Temp 98.6°F | Resp 16 | Ht 71.0 in | Wt 266.0 lb

## 2018-11-04 DIAGNOSIS — K219 Gastro-esophageal reflux disease without esophagitis: Secondary | ICD-10-CM

## 2018-11-04 DIAGNOSIS — E119 Type 2 diabetes mellitus without complications: Secondary | ICD-10-CM

## 2018-11-04 DIAGNOSIS — M170 Bilateral primary osteoarthritis of knee: Secondary | ICD-10-CM | POA: Diagnosis not present

## 2018-11-04 DIAGNOSIS — E349 Endocrine disorder, unspecified: Secondary | ICD-10-CM

## 2018-11-04 DIAGNOSIS — F329 Major depressive disorder, single episode, unspecified: Secondary | ICD-10-CM

## 2018-11-04 DIAGNOSIS — F419 Anxiety disorder, unspecified: Secondary | ICD-10-CM

## 2018-11-04 DIAGNOSIS — M1990 Unspecified osteoarthritis, unspecified site: Secondary | ICD-10-CM

## 2018-11-04 DIAGNOSIS — F32A Depression, unspecified: Secondary | ICD-10-CM

## 2018-11-04 DIAGNOSIS — Z6835 Body mass index (BMI) 35.0-35.9, adult: Secondary | ICD-10-CM

## 2018-11-04 LAB — POCT GLYCOSYLATED HEMOGLOBIN (HGB A1C): Hemoglobin A1C: 6.7 % — AB (ref 4.0–5.6)

## 2018-11-04 MED ORDER — NAPROXEN 500 MG PO TABS: 500.0000 mg | ORAL_TABLET | Freq: Two times a day (BID) | ORAL | 1 refills | Status: DC

## 2018-11-04 NOTE — Patient Instructions (Signed)
Restart Androgel.

## 2018-11-04 NOTE — Progress Notes (Signed)
Patient: Mathew Hill Male    DOB: [birth year]   68 y.o.   MRN: [id number] Visit Date: 11/04/2018  Today's Provider: [name], MD   Chief Complaint  Patient presents with  . Follow-up  . Hypogonadism  . Anxiety  . Knee Pain   Subjective:     HPI  Patient comes in today for a follow up. He was last seen in the office 6 months ago. No medications were changed since last visit. He had routine labs drawn in 01/2018 and all but his testosterone was WNL.   Patient reports that he has not taken Androgel due to cost. He has had insurance issues, and reports that the medication was too expensive. Patient now has a new insurance, and reports that he will try taking it again.  Lab Results  Component Value Date   TESTOSTERONE 147 (L) 02/25/2018   Patient also c/o having bilateral knee pain. He is aware that he has arthritis, but his symptoms are not relieved with Aleve or or Tylenol. Patient is requesting something different to take for this.   Patient was also hyperglycemic on last visit. He has history of this.  Overall he feels pretty well. Lab Results  Component Value Date   HGBA1C 6.7 (A) 11/04/2018     Allergies  Allergen Reactions  . Codeine Other (See Comments)    GI Upset GI Upset GI Upset     Current Outpatient Medications:  .  ALPRAZolam (XANAX) 1 MG tablet, TAKE 1 TABLET BY MOUTH AT BEDTIME AS NEEDED FOR ANXIETY, Disp: 30 tablet, Rfl: 1 .  Cholecalciferol (VITAMIN D3) 5000 UNITS TABS, Take by mouth., Disp: , Rfl:  .  fluocinonide cream (LIDEX) 6.29 %, Apply 1 application topically 2 (two) times daily., Disp: 15 g, Rfl: 0 .  indomethacin (INDOCIN) 50 MG capsule, Take 1 capsule (50 mg total) by mouth 3 (three) times daily as needed., Disp: 270 capsule, Rfl: 1 .  omeprazole (PRILOSEC) 40 MG capsule, Take 1 capsule (40 mg total) by mouth daily., Disp: 90 capsule, Rfl: 1 .  simvastatin (ZOCOR) 40 MG tablet, Take 1 tablet (40 mg total) by mouth daily.,  Disp: 90 tablet, Rfl: 1 .  Testosterone (ANDROGEL PUMP) 20.25 MG/ACT (1.62%) GEL, 2 pumps under each daily, Disp: 75 g, Rfl: 3 .  venlafaxine XR (EFFEXOR-XR) 75 MG 24 hr capsule, Take 3 capsules (225 mg total) by mouth daily., Disp: 270 capsule, Rfl: 1 .  ibuprofen (ADVIL,MOTRIN) 600 MG tablet, Take 1 tablet (600 mg total) by mouth every 6 (six) hours as needed for moderate pain. (Patient not taking: Reported on 02/25/2018), Disp: 30 tablet, Rfl: 0 .  mometasone (ELOCON) 0.1 % cream, Apply 1 application topically daily. (Patient not taking: Reported on 02/25/2018), Disp: 45 g, Rfl: 0  Review of Systems  Constitutional: Negative for activity change, appetite change and fatigue.  Eyes: Negative.   Respiratory: Negative for cough and shortness of breath.   Cardiovascular: Negative for chest pain, palpitations and leg swelling.  Gastrointestinal: Negative.   Endocrine: Negative for cold intolerance, heat intolerance, polydipsia, polyphagia and polyuria.  Musculoskeletal: Positive for arthralgias and myalgias.  Skin: Negative.   Allergic/Immunologic: Negative.   Neurological: Negative for dizziness, light-headedness and headaches.  Psychiatric/Behavioral: Negative for agitation and self-injury. The patient is not nervous/anxious.     Social History   Tobacco Use  . Smoking status: Current Every Day Smoker    Packs/day: 0.50    Years: 45.00  Pack years: 22.50    Types: Cigarettes  . Smokeless tobacco: Never Used  . Tobacco comment: he quit but has started back but is not smoking as much.   Substance Use Topics  . Alcohol use: Yes    Alcohol/week: 0.0 standard drinks    Comment: seldom      Objective:   BP 136/72 (BP Location: Left Arm, Patient Position: Sitting, Cuff Size: Large)   Pulse 72   Temp 98.6 F (37 C)   Resp 16   Ht 5\' 11"  (1.803 m)   Wt 266 lb (120.7 kg)   BMI 37.10 kg/m  Vitals:   11/04/18 1101  BP: 136/72  Pulse: 72  Resp: 16  Temp: 98.6 F (37 C)    Weight: 266 lb (120.7 kg)  Height: 5\' 11"  (1.803 m)     Physical Exam Constitutional:      Appearance: He is well-developed.  HENT:     Head: Normocephalic and atraumatic.     Right Ear: External ear normal.     Left Ear: External ear normal.  Eyes:     General: No scleral icterus. Neck:     Thyroid: No thyromegaly.  Cardiovascular:     Rate and Rhythm: Normal rate and regular rhythm.     Heart sounds: Normal heart sounds.  Pulmonary:     Effort: Pulmonary effort is normal.     Breath sounds: Normal breath sounds.  Abdominal:     Palpations: Abdomen is soft.  Lymphadenopathy:     Cervical: No cervical adenopathy.  Skin:    General: Skin is warm and dry.  Neurological:     Mental Status: He is alert and oriented to person, place, and time.  Psychiatric:        Behavior: Behavior normal.        Thought Content: Thought content normal.        Judgment: Judgment normal.         Assessment & Plan    1. Type 2 diabetes mellitus without complication, unspecified whether long term insulin use (HCC) Pt willing to go to Lifestyle. RTC 4 months. - POCT glycosylated hemoglobin (Hb A1C) - Referral to Nutrition and Diabetes Services  2. Osteoarthritis of both knees, unspecified osteoarthritis type Refer back to ortho. - Ambulatory referral to Orthopedic Surgery - naproxen (NAPROSYN) 500 MG tablet; Take 1 tablet (500 mg total) by mouth 2 (two) times daily with a meal.  Dispense: 30 tablet; Refill: 1  3. Gastroesophageal reflux disease, esophagitis presence not specified   4. Hypotestosteronism Pt wishes to restart Androgel.  5. Arthritis   6. Anxiety   7. Depression, unspecified depression type   8. Class 2 severe obesity due to excess calories with serious comorbidity and body mass index (BMI) of 35.0 to 35.9 in adult (HCC) OA and GERD    I have done the exam and reviewed the above chart and it is accurate to the best of my knowledge. Development worker, community has  been used in this note in any air is in the dictation or transcription are unintentional.  [name], MD  [place]

## 2018-12-04 DIAGNOSIS — M1711 Unilateral primary osteoarthritis, right knee: Secondary | ICD-10-CM | POA: Diagnosis not present

## 2018-12-04 DIAGNOSIS — M175 Other unilateral secondary osteoarthritis of knee: Secondary | ICD-10-CM | POA: Diagnosis not present

## 2018-12-04 DIAGNOSIS — M25561 Pain in right knee: Secondary | ICD-10-CM | POA: Diagnosis not present

## 2018-12-04 DIAGNOSIS — G8929 Other chronic pain: Secondary | ICD-10-CM | POA: Diagnosis not present

## 2018-12-04 DIAGNOSIS — M25562 Pain in left knee: Secondary | ICD-10-CM | POA: Diagnosis not present

## 2018-12-23 ENCOUNTER — Other Ambulatory Visit (HOSPITAL_COMMUNITY): Payer: Self-pay | Admitting: Surgery

## 2018-12-23 ENCOUNTER — Other Ambulatory Visit: Payer: Self-pay | Admitting: Surgery

## 2018-12-23 DIAGNOSIS — G8929 Other chronic pain: Secondary | ICD-10-CM | POA: Diagnosis not present

## 2018-12-23 DIAGNOSIS — M1711 Unilateral primary osteoarthritis, right knee: Secondary | ICD-10-CM | POA: Diagnosis not present

## 2018-12-23 DIAGNOSIS — M25562 Pain in left knee: Secondary | ICD-10-CM | POA: Diagnosis not present

## 2018-12-23 DIAGNOSIS — M1712 Unilateral primary osteoarthritis, left knee: Secondary | ICD-10-CM | POA: Insufficient documentation

## 2018-12-29 ENCOUNTER — Ambulatory Visit: Payer: Worker's Compensation

## 2019-02-05 ENCOUNTER — Other Ambulatory Visit: Payer: Self-pay | Admitting: Family Medicine

## 2019-02-05 DIAGNOSIS — F419 Anxiety disorder, unspecified: Secondary | ICD-10-CM

## 2019-02-05 DIAGNOSIS — E7800 Pure hypercholesterolemia, unspecified: Secondary | ICD-10-CM

## 2019-02-05 DIAGNOSIS — K219 Gastro-esophageal reflux disease without esophagitis: Secondary | ICD-10-CM

## 2019-02-05 NOTE — Telephone Encounter (Signed)
Pt needs refill on   Simvastatin 40 mg  Omeprazole 40 mg  Alprazolam 1mg   [facility] [address]  Thanks teri

## 2019-02-10 MED ORDER — SIMVASTATIN 40 MG PO TABS
40.0000 mg | ORAL_TABLET | Freq: Every day | ORAL | 1 refills | Status: DC
Start: 2019-02-10 — End: 2020-02-03

## 2019-02-10 MED ORDER — ALPRAZOLAM 1 MG PO TABS: ORAL_TABLET | ORAL | 1 refills | Status: DC

## 2019-02-10 MED ORDER — OMEPRAZOLE 40 MG PO CPDR: 40.0000 mg | DELAYED_RELEASE_CAPSULE | Freq: Every day | ORAL | 1 refills | Status: DC

## 2019-04-14 ENCOUNTER — Ambulatory Visit: Payer: Self-pay | Admitting: Family Medicine

## 2019-04-22 ENCOUNTER — Other Ambulatory Visit: Payer: Self-pay

## 2019-04-22 ENCOUNTER — Ambulatory Visit (INDEPENDENT_AMBULATORY_CARE_PROVIDER_SITE_OTHER): Payer: Medicare Other | Admitting: Family Medicine

## 2019-04-22 ENCOUNTER — Encounter: Payer: Self-pay | Admitting: Family Medicine

## 2019-04-22 VITALS — BP 129/84 | HR 84 | Temp 97.9°F | Resp 16 | Wt 258.2 lb

## 2019-04-22 DIAGNOSIS — E7800 Pure hypercholesterolemia, unspecified: Secondary | ICD-10-CM

## 2019-04-22 DIAGNOSIS — E119 Type 2 diabetes mellitus without complications: Secondary | ICD-10-CM | POA: Diagnosis not present

## 2019-04-22 DIAGNOSIS — K219 Gastro-esophageal reflux disease without esophagitis: Secondary | ICD-10-CM

## 2019-04-22 DIAGNOSIS — E349 Endocrine disorder, unspecified: Secondary | ICD-10-CM | POA: Diagnosis not present

## 2019-04-22 DIAGNOSIS — Z6835 Body mass index (BMI) 35.0-35.9, adult: Secondary | ICD-10-CM

## 2019-04-22 LAB — POCT GLYCOSYLATED HEMOGLOBIN (HGB A1C): Hemoglobin A1C: 6.1 % — AB (ref 4.0–5.6)

## 2019-04-22 NOTE — Progress Notes (Signed)
Patient: Mathew Hill Male    DOB: 1951/08/26   68 y.o.   MRN: [id number] Visit Date: 04/22/2019  Today's Provider: [name], MD   Chief Complaint  Patient presents with  . Diabetes   Subjective:     HPI  Diabetes Mellitus Type II, Follow-up:   Lab Results  Component Value Date   HGBA1C 6.7 (A) 11/04/2018   HGBA1C 6.3 (H) 02/25/2018   HGBA1C 6.1 08/20/2017    Last seen for diabetes 5 months ago.  Management since then includes referral to nutrition and diabetes services. Patient reports that he has not followed up with nutrionist and diet has been poor.     Current Insulin Regimen: n/a Most Recent Eye Exam:1 yr patient reports Weight trend: fluctuating a bit Prior visit with dietician: No Current exercise: none Current diet habits: in general, an "unhealthy" diet   He is taking his meds but has been off of testosterone for some time. He does want this level checked and c/o chronic fatigue which is unchanged.  He has no CV symptoms.  Pertinent Labs:    Component Value Date/Time   CHOL 125 02/25/2018 1439   TRIG 164 (H) 02/25/2018 1439   HDL 28 (L) 02/25/2018 1439   LDLCALC 64 02/25/2018 1439   CREATININE 1.16 02/25/2018 1439    Wt Readings from Last 3 Encounters:  04/22/19 258 lb 3.2 oz (117.1 kg)  11/04/18 266 lb (120.7 kg)  02/25/18 263 lb (119.3 kg)    ------------------------------------------------------------------------  Allergies  Allergen Reactions  . Codeine Other (See Comments)    GI Upset GI Upset GI Upset     Current Outpatient Medications:  .  ALPRAZolam (XANAX) 1 MG tablet, TAKE 1 TABLET BY MOUTH AT BEDTIME AS NEEDED FOR ANXIETY, Disp: 30 tablet, Rfl: 1 .  Cholecalciferol (VITAMIN D3) 5000 UNITS TABS, Take by mouth., Disp: , Rfl:  .  diclofenac sodium (VOLTAREN) 1 % GEL, APPLY 2 GRAMS TOPICALLY 4 TIMES DAILY AS NEEDED (KNEE PAIN), Disp: , Rfl:  .  fluocinonide cream (LIDEX) 1.60 %, Apply 1 application topically  2 (two) times daily., Disp: 15 g, Rfl: 0 .  ibuprofen (ADVIL,MOTRIN) 600 MG tablet, Take 1 tablet (600 mg total) by mouth every 6 (six) hours as needed for moderate pain., Disp: 30 tablet, Rfl: 0 .  indomethacin (INDOCIN) 50 MG capsule, Take 1 capsule (50 mg total) by mouth 3 (three) times daily as needed., Disp: 270 capsule, Rfl: 1 .  naproxen (NAPROSYN) 500 MG tablet, Take 1 tablet (500 mg total) by mouth 2 (two) times daily with a meal., Disp: 30 tablet, Rfl: 1 .  omeprazole (PRILOSEC) 40 MG capsule, Take 1 capsule (40 mg total) by mouth daily., Disp: 90 capsule, Rfl: 1 .  simvastatin (ZOCOR) 40 MG tablet, Take 1 tablet (40 mg total) by mouth daily., Disp: 90 tablet, Rfl: 1 .  venlafaxine XR (EFFEXOR-XR) 75 MG 24 hr capsule, Take 3 capsules (225 mg total) by mouth daily., Disp: 270 capsule, Rfl: 1 .  mometasone (ELOCON) 0.1 % cream, Apply 1 application topically daily. (Patient not taking: Reported on 04/22/2019), Disp: 45 g, Rfl: 0 .  Testosterone (ANDROGEL PUMP) 20.25 MG/ACT (1.62%) GEL, 2 pumps under each daily (Patient not taking: Reported on 04/22/2019), Disp: 75 g, Rfl: 3  Review of Systems  Constitutional: Negative.   HENT: Positive for postnasal drip.   Respiratory: Negative.   Gastrointestinal: Negative.   Endocrine: Positive for polydipsia.  Genitourinary:  Negative.   Musculoskeletal: Positive for arthralgias, back pain and joint swelling.  Allergic/Immunologic: Negative.   Neurological: Positive for numbness (patient reports he has numbness at times in his toes).  Psychiatric/Behavioral: Negative.     Social History   Tobacco Use  . Smoking status: Current Every Day Smoker    Packs/day: 0.50    Years: 45.00    Pack years: 22.50    Types: Cigarettes  . Smokeless tobacco: Never Used  . Tobacco comment: he quit but has started back but is not smoking as much.   Substance Use Topics  . Alcohol use: Yes    Alcohol/week: 0.0 standard drinks    Comment: seldom       Objective:   BP 129/84   Pulse 84   Temp 97.9 F (36.6 C) (Oral)   Resp 16   Wt 258 lb 3.2 oz (117.1 kg)   BMI 36.01 kg/m  Vitals:   04/22/19 1155  BP: 129/84  Pulse: 84  Resp: 16  Temp: 97.9 F (36.6 C)  TempSrc: Oral  Weight: 258 lb 3.2 oz (117.1 kg)     Physical Exam Vitals signs reviewed.  Constitutional:      Appearance: He is well-developed.  HENT:     Head: Normocephalic and atraumatic.     Right Ear: External ear normal.     Left Ear: External ear normal.  Eyes:     General: No scleral icterus. Neck:     Thyroid: No thyromegaly.  Cardiovascular:     Rate and Rhythm: Normal rate and regular rhythm.     Heart sounds: Normal heart sounds.  Pulmonary:     Effort: Pulmonary effort is normal.     Breath sounds: Normal breath sounds.  Abdominal:     Palpations: Abdomen is soft.  Lymphadenopathy:     Cervical: No cervical adenopathy.  Skin:    General: Skin is warm and dry.  Neurological:     Mental Status: He is alert and oriented to person, place, and time. Mental status is at baseline.  Psychiatric:        Mood and Affect: Mood normal.        Behavior: Behavior normal.        Thought Content: Thought content normal.        Judgment: Judgment normal.      No results found for any visits on 04/22/19.     Assessment & Plan    1. Type 2 diabetes mellitus without complication, unspecified whether long term insulin use (HCC) Controlled. - POCT glycosylated hemoglobin (Hb A1C)--6.1 today - CBC with Differential/Platelet  2. Hypotestosteronism I would prefer not to restart testosterone therapy in pt who is minimally compliant with follow up. - PSA - Testosterone,Free and Total  3. Hypercholesterolemia without hypertriglyceridemia  - Lipid panel - Comprehensive metabolic panel - TSH  4. Gastroesophageal reflux disease, esophagitis presence not specified   5. Class 2 severe obesity due to excess calories with serious comorbidity and body mass  index (BMI) of 35.0 to 35.9 in adult [facility]) With DM/HLD     [name], MD  [facility] Clint Bolder as a scribe for [name], MD.,have documented all relevant documentation on the behalf of [name], MD,as directed by  [name], MD while in the presence of [name], MD.

## 2019-04-24 ENCOUNTER — Telehealth: Payer: Self-pay

## 2019-04-24 LAB — CBC WITH DIFFERENTIAL/PLATELET
Basophils Absolute: 0.1 10*3/uL (ref 0.0–0.2)
Basos: 1 %
EOS (ABSOLUTE): 0.3 10*3/uL (ref 0.0–0.4)
Eos: 4 %
Hematocrit: 43.5 % (ref 37.5–51.0)
Hemoglobin: 14.9 g/dL (ref 13.0–17.7)
Immature Grans (Abs): 0 10*3/uL (ref 0.0–0.1)
Immature Granulocytes: 1 %
Lymphocytes Absolute: 1.5 10*3/uL (ref 0.7–3.1)
Lymphs: 19 %
MCH: 30.5 pg (ref 26.6–33.0)
MCHC: 34.3 g/dL (ref 31.5–35.7)
MCV: 89 fL (ref 79–97)
Monocytes Absolute: 0.5 10*3/uL (ref 0.1–0.9)
Monocytes: 6 %
Neutrophils Absolute: 5.3 10*3/uL (ref 1.4–7.0)
Neutrophils: 69 %
Platelets: 234 10*3/uL (ref 150–450)
RBC: 4.88 x10E6/uL (ref 4.14–5.80)
RDW: 13.5 % (ref 11.6–15.4)
WBC: 7.7 10*3/uL (ref 3.4–10.8)

## 2019-04-24 LAB — COMPREHENSIVE METABOLIC PANEL
ALT: 27 IU/L (ref 0–44)
AST: 19 IU/L (ref 0–40)
Albumin/Globulin Ratio: 2 (ref 1.2–2.2)
Albumin: 4.6 g/dL (ref 3.8–4.8)
Alkaline Phosphatase: 108 IU/L (ref 39–117)
BUN/Creatinine Ratio: 14 (ref 10–24)
BUN: 17 mg/dL (ref 8–27)
Bilirubin Total: 0.5 mg/dL (ref 0.0–1.2)
CO2: 22 mmol/L (ref 20–29)
Calcium: 9.7 mg/dL (ref 8.6–10.2)
Chloride: 104 mmol/L (ref 96–106)
Creatinine, Ser: 1.24 mg/dL (ref 0.76–1.27)
GFR calc Af Amer: 70 mL/min/{1.73_m2} (ref >59)
GFR calc non Af Amer: 61 mL/min/{1.73_m2} (ref >59)
Globulin, Total: 2.3 g/dL (ref 1.5–4.5)
Glucose: 105 mg/dL — ABNORMAL HIGH (ref 65–99)
Potassium: 4.2 mmol/L (ref 3.5–5.2)
Sodium: 141 mmol/L (ref 134–144)
Total Protein: 6.9 g/dL (ref 6.0–8.5)

## 2019-04-24 LAB — TESTOSTERONE,FREE AND TOTAL
Testosterone, Free: 4.6 pg/mL — ABNORMAL LOW (ref 6.6–18.1)
Testosterone: 134 ng/dL — ABNORMAL LOW (ref 264–916)

## 2019-04-24 LAB — LIPID PANEL
Chol/HDL Ratio: 4.9 ratio (ref 0.0–5.0)
Cholesterol, Total: 133 mg/dL (ref 100–199)
HDL: 27 mg/dL — ABNORMAL LOW (ref >39)
LDL Calculated: 42 mg/dL (ref 0–99)
Triglycerides: 320 mg/dL — ABNORMAL HIGH (ref 0–149)
VLDL Cholesterol Cal: 64 mg/dL — ABNORMAL HIGH (ref 5–40)

## 2019-04-24 LAB — TSH: TSH: 1.24 u[IU]/mL (ref 0.450–4.500)

## 2019-04-24 LAB — PSA: Prostate Specific Ag, Serum: 1.1 ng/mL (ref 0.0–4.0)

## 2019-04-24 NOTE — Telephone Encounter (Signed)
I can refer him to urology if he wants to discuss options.

## 2019-04-24 NOTE — Telephone Encounter (Signed)
-----   Message from Jerrol Banana., MD sent at 04/24/2019  7:54 AM EDT ----- Labs all Stable,,testosterone low.

## 2019-04-24 NOTE — Telephone Encounter (Signed)
[id number] 

## 2019-04-24 NOTE — Telephone Encounter (Signed)
Patient was advised he wanted to know if there is a testosterone pill he can take to help his testosterone levels?KW

## 2019-05-01 ENCOUNTER — Other Ambulatory Visit: Payer: Self-pay

## 2019-05-01 DIAGNOSIS — E349 Endocrine disorder, unspecified: Secondary | ICD-10-CM

## 2019-05-01 DIAGNOSIS — E291 Testicular hypofunction: Secondary | ICD-10-CM

## 2019-05-01 NOTE — Telephone Encounter (Signed)
Pt returned call ° °teri °

## 2019-05-01 NOTE — Telephone Encounter (Signed)
Tried to call patient regarding referral. No answer, unable to leave voicemail.

## 2019-05-01 NOTE — Telephone Encounter (Signed)
Patient states that he would like to have the referral. Please advise.

## 2019-05-01 NOTE — Telephone Encounter (Signed)
Refer to urology.  ?

## 2019-06-04 ENCOUNTER — Telehealth: Payer: Self-pay | Admitting: Family Medicine

## 2019-06-04 NOTE — Telephone Encounter (Signed)
Pt needing to know why or if [facility] sent him a urine testing kit in the mail yesterday.  Please call pt back to let him know.  Thanks, [employer]

## 2019-06-04 NOTE — Telephone Encounter (Signed)
I am not aware of this. Was there something you mentioned to the patient or did you want this to be done? Please advise. Thanks!

## 2019-06-05 NOTE — Telephone Encounter (Signed)
I know nothing about it.  °

## 2019-06-05 NOTE — Telephone Encounter (Signed)
Pt advised.  He is going to disregard the package.    Thanks,   -Mickel Baas

## 2019-06-20 ENCOUNTER — Ambulatory Visit: Payer: Medicare Other | Admitting: Urology

## 2019-08-29 DIAGNOSIS — M79675 Pain in left toe(s): Secondary | ICD-10-CM | POA: Diagnosis not present

## 2019-09-01 ENCOUNTER — Telehealth: Payer: Self-pay | Admitting: Family Medicine

## 2019-09-01 NOTE — Telephone Encounter (Signed)
Okay to advise patient that medication is okay to take. KW

## 2019-09-01 NOTE — Telephone Encounter (Signed)
yes

## 2019-09-01 NOTE — Telephone Encounter (Signed)
Patient has been advised. KW 

## 2019-09-01 NOTE — Telephone Encounter (Signed)
Pt thought he broke his foot. He went to [facility] and was told he has a case of gout. He was prescribed Methylpred 4mg  pack and Colchicine 0.6mg  tab  Pt is ask ing if Dr. Rosanna Randy thinks this will be ok for him to take?  Please call pt back at [phone] asap to let him know.  Thanks, [employer]

## 2019-09-16 ENCOUNTER — Other Ambulatory Visit: Payer: Self-pay

## 2019-09-16 NOTE — Patient Outreach (Signed)
[facility]) Care Management  09/16/2019  Mathew Hill 1951-06-06 [id number]   Medication Adherence call to [name] Compliant Voice message left with a call back number. [name] is showing past due on Simvastatin 40 mg under [place].   [place] Management Direct Dial [phone]  Fax [phone] [email or web address]

## 2019-09-30 ENCOUNTER — Other Ambulatory Visit: Payer: Self-pay

## 2019-09-30 NOTE — Patient Outreach (Signed)
[name]) Care Management  09/30/2019  Mathew Hill [birth year] [id number]   Medication Adherence call to Mathew Hill [facility] Identifiers Verify spoke with patient he is past due on Simvastatin 40 mg,patient explain he takes 1 tablet daily,patient has enough until his next doctors appointment, Mathew Hill is showing past due under [place].   [place] Management Direct Dial [phone]  Fax [phone] [email or web address]

## 2019-10-10 DIAGNOSIS — M25572 Pain in left ankle and joints of left foot: Secondary | ICD-10-CM | POA: Diagnosis not present

## 2019-10-22 ENCOUNTER — Ambulatory Visit: Payer: Self-pay | Admitting: Family Medicine

## 2019-10-27 DIAGNOSIS — H2513 Age-related nuclear cataract, bilateral: Secondary | ICD-10-CM | POA: Diagnosis not present

## 2019-10-27 LAB — HM DIABETES EYE EXAM

## 2019-11-03 ENCOUNTER — Ambulatory Visit: Payer: Self-pay | Admitting: Family Medicine

## 2019-11-10 NOTE — Progress Notes (Deleted)
       Patient: Mathew Hill Male    DOB: 1951/06/01   69 y.o.   MRN: [id number] Visit Date: 11/10/2019  Today's Provider: [name], MD   No chief complaint on file.  Subjective:     HPI  Allergies  Allergen Reactions  . Codeine Other (See Comments)    GI Upset GI Upset GI Upset     Current Outpatient Medications:  .  ALPRAZolam (XANAX) 1 MG tablet, TAKE 1 TABLET BY MOUTH AT BEDTIME AS NEEDED FOR ANXIETY, Disp: 30 tablet, Rfl: 1 .  Cholecalciferol (VITAMIN D3) 5000 UNITS TABS, Take by mouth., Disp: , Rfl:  .  colchicine 0.6 MG tablet, Take 0.6 mg by mouth 2 (two) times daily., Disp: , Rfl:  .  diclofenac sodium (VOLTAREN) 1 % GEL, APPLY 2 GRAMS TOPICALLY 4 TIMES DAILY AS NEEDED (KNEE PAIN), Disp: , Rfl:  .  fluocinonide cream (LIDEX) [id number] %, Apply 1 application topically 2 (two) times daily., Disp: 15 g, Rfl: 0 .  ibuprofen (ADVIL,MOTRIN) 600 MG tablet, Take 1 tablet (600 mg total) by mouth every 6 (six) hours as needed for moderate pain., Disp: 30 tablet, Rfl: 0 .  indomethacin (INDOCIN) 50 MG capsule, Take 1 capsule (50 mg total) by mouth 3 (three) times daily as needed., Disp: 270 capsule, Rfl: 1 .  mometasone (ELOCON) 0.1 % cream, Apply 1 application topically daily. (Patient not taking: Reported on 04/22/2019), Disp: 45 g, Rfl: 0 .  naproxen (NAPROSYN) 500 MG tablet, Take 1 tablet (500 mg total) by mouth 2 (two) times daily with a meal., Disp: 30 tablet, Rfl: 1 .  omeprazole (PRILOSEC) 40 MG capsule, Take 1 capsule (40 mg total) by mouth daily., Disp: 90 capsule, Rfl: 1 .  simvastatin (ZOCOR) 40 MG tablet, Take 1 tablet (40 mg total) by mouth daily., Disp: 90 tablet, Rfl: 1 .  Testosterone (ANDROGEL PUMP) 20.25 MG/ACT (1.62%) GEL, 2 pumps under each daily (Patient not taking: Reported on 04/22/2019), Disp: 75 g, Rfl: 3 .  venlafaxine XR (EFFEXOR-XR) 75 MG 24 hr capsule, Take 3 capsules (225 mg total) by mouth daily., Disp: 270 capsule, Rfl: 1  Review of  Systems  Social History   Tobacco Use  . Smoking status: Current Every Day Smoker    Packs/day: 0.50    Years: 45.00    Pack years: 22.50    Types: Cigarettes  . Smokeless tobacco: Never Used  . Tobacco comment: he quit but has started back but is not smoking as much.   Substance Use Topics  . Alcohol use: Yes    Alcohol/week: 0.0 standard drinks    Comment: seldom      Objective:   There were no vitals taken for this visit. There were no vitals filed for this visit.There is no height or weight on file to calculate BMI.   Physical Exam   No results found for any visits on 11/11/19.     Assessment & Plan        [name], MD  [facility]

## 2019-11-11 ENCOUNTER — Ambulatory Visit: Payer: Self-pay | Admitting: Family Medicine

## 2019-11-24 ENCOUNTER — Telehealth (INDEPENDENT_AMBULATORY_CARE_PROVIDER_SITE_OTHER): Payer: Medicare Other | Admitting: Family Medicine

## 2019-11-24 DIAGNOSIS — E349 Endocrine disorder, unspecified: Secondary | ICD-10-CM | POA: Diagnosis not present

## 2019-11-24 DIAGNOSIS — Z6835 Body mass index (BMI) 35.0-35.9, adult: Secondary | ICD-10-CM

## 2019-11-24 DIAGNOSIS — R112 Nausea with vomiting, unspecified: Secondary | ICD-10-CM | POA: Diagnosis not present

## 2019-11-24 DIAGNOSIS — E66812 Obesity, class 2: Secondary | ICD-10-CM

## 2019-11-24 DIAGNOSIS — R197 Diarrhea, unspecified: Secondary | ICD-10-CM | POA: Diagnosis not present

## 2019-11-24 NOTE — Progress Notes (Signed)
Patient: Mathew Hill Male    DOB: 11-May-1951   69 y.o.   MRN: [id number] Visit Date: 11/24/2019  Today's Provider: [name], MD   No chief complaint on file.  Subjective:     HPI Patient overall feeling well recently.  His wife is home from work after they had to close her place of employment due to [place].  She is well but out of work for a few days.  Patient did have 36 hours of nausea vomiting and diarrhea a few days ago.  He is concerned about possible Covid exposure.  I agree with this concern and we will test him. Chronic problems are stable. Allergies  Allergen Reactions  . Codeine Other (See Comments)    GI Upset GI Upset GI Upset     Current Outpatient Medications:  .  ALPRAZolam (XANAX) 1 MG tablet, TAKE 1 TABLET BY MOUTH AT BEDTIME AS NEEDED FOR ANXIETY, Disp: 30 tablet, Rfl: 1 .  Cholecalciferol (VITAMIN D3) 5000 UNITS TABS, Take by mouth., Disp: , Rfl:  .  colchicine 0.6 MG tablet, Take 0.6 mg by mouth 2 (two) times daily., Disp: , Rfl:  .  diclofenac sodium (VOLTAREN) 1 % GEL, APPLY 2 GRAMS TOPICALLY 4 TIMES DAILY AS NEEDED (KNEE PAIN), Disp: , Rfl:  .  fluocinonide cream (LIDEX) [id number] %, Apply 1 application topically 2 (two) times daily., Disp: 15 g, Rfl: 0 .  ibuprofen (ADVIL,MOTRIN) 600 MG tablet, Take 1 tablet (600 mg total) by mouth every 6 (six) hours as needed for moderate pain., Disp: 30 tablet, Rfl: 0 .  indomethacin (INDOCIN) 50 MG capsule, Take 1 capsule (50 mg total) by mouth 3 (three) times daily as needed., Disp: 270 capsule, Rfl: 1 .  mometasone (ELOCON) 0.1 % cream, Apply 1 application topically daily. (Patient not taking: Reported on 04/22/2019), Disp: 45 g, Rfl: 0 .  naproxen (NAPROSYN) 500 MG tablet, Take 1 tablet (500 mg total) by mouth 2 (two) times daily with a meal., Disp: 30 tablet, Rfl: 1 .  omeprazole (PRILOSEC) 40 MG capsule, Take 1 capsule (40 mg total) by mouth daily., Disp: 90 capsule, Rfl: 1 .  simvastatin (ZOCOR) 40  MG tablet, Take 1 tablet (40 mg total) by mouth daily., Disp: 90 tablet, Rfl: 1 .  Testosterone (ANDROGEL PUMP) 20.25 MG/ACT (1.62%) GEL, 2 pumps under each daily (Patient not taking: Reported on 04/22/2019), Disp: 75 g, Rfl: 3 .  venlafaxine XR (EFFEXOR-XR) 75 MG 24 hr capsule, Take 3 capsules (225 mg total) by mouth daily., Disp: 270 capsule, Rfl: 1  Review of Systems  Constitutional: Negative for appetite change, chills and fever.  Respiratory: Negative for chest tightness, shortness of breath and wheezing.   Cardiovascular: Negative for chest pain and palpitations.  Gastrointestinal: Negative for abdominal pain, nausea and vomiting.    Social History   Tobacco Use  . Smoking status: Current Every Day Smoker    Packs/day: 0.50    Years: 45.00    Pack years: 22.50    Types: Cigarettes  . Smokeless tobacco: Never Used  . Tobacco comment: he quit but has started back but is not smoking as much.   Substance Use Topics  . Alcohol use: Yes    Alcohol/week: 0.0 standard drinks    Comment: seldom      Objective:   There were no vitals taken for this visit. There were no vitals filed for this visit.There is no height or weight on file  to calculate BMI.   Physical Exam   No results found for any visits on 11/24/19.     Assessment & Plan    1. Non-intractable vomiting with nausea, unspecified vomiting type Obtain Covid test although unlikely to be positive.  Patient in agreement with this plan and will go online to check Covid test and will quarantine until he gets results back.  2. Diarrhea, unspecified type   3. Class 2 severe obesity due to excess calories with serious comorbidity and body mass index (BMI) of 35.0 to 35.9 in adult ([place])   4. Hypotestosteronism      Richard Cranford Mon, MD  [facility]

## 2019-11-25 ENCOUNTER — Other Ambulatory Visit: Payer: Worker's Compensation

## 2020-02-03 ENCOUNTER — Other Ambulatory Visit: Payer: Self-pay | Admitting: Family Medicine

## 2020-02-03 DIAGNOSIS — K219 Gastro-esophageal reflux disease without esophagitis: Secondary | ICD-10-CM

## 2020-02-03 DIAGNOSIS — F419 Anxiety disorder, unspecified: Secondary | ICD-10-CM

## 2020-02-03 DIAGNOSIS — E7800 Pure hypercholesterolemia, unspecified: Secondary | ICD-10-CM

## 2020-02-03 NOTE — Telephone Encounter (Signed)
Please advise 

## 2020-02-03 NOTE — Telephone Encounter (Signed)
Requested medications are due for refill today?  Yes  Requested medications are on active medication list?  Yes  Last Refill:   02/10/2019  # 30 with 1 refill  Future visit scheduled?   No  Notes to Clinic:  This medication refill request cannot be delegated.

## 2020-02-03 NOTE — Telephone Encounter (Signed)
Fill Date Drug Qty Days Prescriber Pharm Refill MgEq MgEq/Day  07/07/2019 Alprazolam 1 MG Tablet 30.00 30        Will fill script. PMP reviewed. Provider filled. Dr. Rosanna Randy is out of the office.  Patient should follow up if symptoms changing or worsening at anytime.

## 2020-02-03 NOTE — Telephone Encounter (Signed)
refilled 30 tablets 02/03/2020. PMP aware reviewed. Dr. Rosanna Randy out of the office. Last refill was 07/2019 [facility] Date Drug Qty Days Prescriber Pharm Refill MgEq MgEq/Day  07/07/2019 Alprazolam 1 MG Tablet 30.00 30

## 2020-02-03 NOTE — Telephone Encounter (Signed)
Requested Prescriptions  Pending Prescriptions Disp Refills  . omeprazole (PRILOSEC) 40 MG capsule [Pharmacy Med Name: Omeprazole 40 MG Oral Capsule Delayed Release] 90 capsule 0    Sig: Take 1 capsule by mouth once daily     Gastroenterology: Proton Pump Inhibitors Passed - 02/03/2020  2:31 AM      Passed - Valid encounter within last 12 months    Recent Outpatient Visits          2 months ago Non-intractable vomiting with nausea, unspecified vomiting type   [facility] Jerrol Banana., MD   9 months ago Type 2 diabetes mellitus without complication, unspecified whether long term insulin use [facility])   [facility] Jerrol Banana., MD   1 year ago Type 2 diabetes mellitus without complication, unspecified whether long term insulin use [facility])   [facility] Jerrol Banana., MD   1 year ago [place] Jerrol Banana., MD   2 years ago Type 2 diabetes mellitus with complication, unspecified whether long term insulin use [facility])   [facility] Jerrol Banana., MD             . ALPRAZolam Duanne Moron) 1 MG tablet [Pharmacy Med Name: ALPRAZolam 1 MG Oral Tablet] 30 tablet 0    Sig: TAKE 1 TABLET BY MOUTH AT BEDTIME AS NEEDED FOR ANXIETY     Not Delegated - Psychiatry:  Anxiolytics/Hypnotics Failed - 02/03/2020  2:31 AM      Failed - This refill cannot be delegated      Failed - Urine Drug Screen completed in last 360 days.      Passed - Valid encounter within last 6 months    Recent Outpatient Visits          2 months ago Non-intractable vomiting with nausea, unspecified vomiting type   [facility] Jerrol Banana., MD   9 months ago Type 2 diabetes mellitus without complication, unspecified whether long term insulin use [facility])   [facility] Jerrol Banana., MD   1 year ago Type 2 diabetes mellitus without complication, unspecified  whether long term insulin use [facility])   [facility] Jerrol Banana., MD   1 year ago [place] Jerrol Banana., MD   2 years ago Type 2 diabetes mellitus with complication, unspecified whether long term insulin use [facility])   [facility] Jerrol Banana., MD             . simvastatin (ZOCOR) 40 MG tablet [Pharmacy Med Name: Simvastatin 40 MG Oral Tablet] 90 tablet 0    Sig: Take 1 tablet by mouth once daily     Cardiovascular:  Antilipid - Statins Failed - 02/03/2020  2:31 AM      Failed - HDL in normal range and within 360 days    HDL  Date Value Ref Range Status  04/22/2019 27 (L) >39 mg/dL Final         Failed - Triglycerides in normal range and within 360 days    Triglycerides  Date Value Ref Range Status  04/22/2019 320 (H) 0 - 149 mg/dL Final         Passed - Total Cholesterol in normal range and within 360 days    Cholesterol, Total  Date Value Ref Range Status  04/22/2019 133 100 - 199 mg/dL Final  Passed - LDL in normal range and within 360 days    LDL Calculated  Date Value Ref Range Status  04/22/2019 42 0 - 99 mg/dL Final         Passed - Patient is not pregnant      Passed - Valid encounter within last 12 months    Recent Outpatient Visits          2 months ago Non-intractable vomiting with nausea, unspecified vomiting type   [facility] Jerrol Banana., MD   9 months ago Type 2 diabetes mellitus without complication, unspecified whether long term insulin use [facility])   [facility] Jerrol Banana., MD   1 year ago Type 2 diabetes mellitus without complication, unspecified whether long term insulin use [facility])   [facility] Jerrol Banana., MD   1 year ago [place] Jerrol Banana., MD   2 years ago Type 2 diabetes mellitus with complication, unspecified whether long term insulin  use [facility])   [facility] Jerrol Banana., MD

## 2020-03-19 LAB — HM DIABETES EYE EXAM

## 2020-03-25 DIAGNOSIS — K047 Periapical abscess without sinus: Secondary | ICD-10-CM | POA: Diagnosis not present

## 2020-05-14 ENCOUNTER — Other Ambulatory Visit: Payer: Self-pay | Admitting: Family Medicine

## 2020-05-14 DIAGNOSIS — E7800 Pure hypercholesterolemia, unspecified: Secondary | ICD-10-CM

## 2020-08-17 DIAGNOSIS — H468 Other optic neuritis: Secondary | ICD-10-CM | POA: Diagnosis not present

## 2020-08-17 DIAGNOSIS — H2512 Age-related nuclear cataract, left eye: Secondary | ICD-10-CM | POA: Diagnosis not present

## 2020-08-17 DIAGNOSIS — Z01818 Encounter for other preprocedural examination: Secondary | ICD-10-CM | POA: Diagnosis not present

## 2020-08-17 DIAGNOSIS — H25811 Combined forms of age-related cataract, right eye: Secondary | ICD-10-CM | POA: Diagnosis not present

## 2020-08-26 ENCOUNTER — Other Ambulatory Visit: Payer: Self-pay | Admitting: Family Medicine

## 2020-08-26 DIAGNOSIS — E7800 Pure hypercholesterolemia, unspecified: Secondary | ICD-10-CM

## 2020-08-30 ENCOUNTER — Other Ambulatory Visit: Payer: Self-pay

## 2020-08-30 ENCOUNTER — Encounter: Payer: Self-pay | Admitting: Family Medicine

## 2020-08-30 ENCOUNTER — Ambulatory Visit (INDEPENDENT_AMBULATORY_CARE_PROVIDER_SITE_OTHER): Payer: Medicare Other | Admitting: Family Medicine

## 2020-08-30 VITALS — BP 146/85 | HR 78 | Temp 98.4°F | Resp 16 | Ht 71.0 in | Wt 223.0 lb

## 2020-08-30 DIAGNOSIS — E785 Hyperlipidemia, unspecified: Secondary | ICD-10-CM | POA: Diagnosis not present

## 2020-08-30 DIAGNOSIS — F32A Depression, unspecified: Secondary | ICD-10-CM | POA: Diagnosis not present

## 2020-08-30 DIAGNOSIS — E119 Type 2 diabetes mellitus without complications: Secondary | ICD-10-CM

## 2020-08-30 NOTE — Progress Notes (Signed)
I,[name],acting as a scribe for Mathew Durie, MD.,have documented all relevant documentation on the behalf of Mathew Durie, MD,as directed by  Mathew Durie, MD while in the presence of Mathew Durie, MD.   Established patient visit   Patient: Mathew Hill   DOB: [birth year]   69 y.o. Male  MRN: [id number] Visit Date: 08/30/2020  Today's healthcare provider: [name], MD   Chief Complaint  Patient presents with  . Follow-up   Subjective    HPI  Patient doing well.  He is now retired and has been working hard on dietary changes and has lost 25 pounds on purpose since his last visit.      Medications: Outpatient Medications Prior to Visit  Medication Sig  . ALPRAZolam (XANAX) 1 MG tablet TAKE 1 TABLET BY MOUTH AT BEDTIME AS NEEDED FOR ANXIETY  . Cholecalciferol (VITAMIN D3) 5000 UNITS TABS Take by mouth.  . indomethacin (INDOCIN) 50 MG capsule Take 1 capsule (50 mg total) by mouth 3 (three) times daily as needed.  Marland Kitchen omeprazole (PRILOSEC) 40 MG capsule Take 1 capsule by mouth once daily  . simvastatin (ZOCOR) 40 MG tablet Take 1 tablet by mouth once daily  . venlafaxine XR (EFFEXOR-XR) 75 MG 24 hr capsule Take 3 capsules (225 mg total) by mouth daily.  . colchicine 0.6 MG tablet Take 0.6 mg by mouth 2 (two) times daily. (Patient not taking: Reported on 08/30/2020)  . diclofenac sodium (VOLTAREN) 1 % GEL APPLY 2 GRAMS TOPICALLY 4 TIMES DAILY AS NEEDED (KNEE PAIN) (Patient not taking: Reported on 08/30/2020)  . fluocinonide cream (LIDEX) 1.95 % Apply 1 application topically 2 (two) times daily. (Patient not taking: Reported on 08/30/2020)  . ibuprofen (ADVIL,MOTRIN) 600 MG tablet Take 1 tablet (600 mg total) by mouth every 6 (six) hours as needed for moderate pain. (Patient not taking: Reported on 08/30/2020)  . mometasone (ELOCON) 0.1 % cream Apply 1 application topically daily. (Patient not taking: Reported on 04/22/2019)  . naproxen (NAPROSYN)  500 MG tablet Take 1 tablet (500 mg total) by mouth 2 (two) times daily with a meal. (Patient not taking: Reported on 08/30/2020)  . Testosterone (ANDROGEL PUMP) 20.25 MG/ACT (1.62%) GEL 2 pumps under each daily (Patient not taking: Reported on 04/22/2019)   No facility-administered medications prior to visit.    Review of Systems  Constitutional: Negative for appetite change, chills and fever.  Respiratory: Negative for chest tightness, shortness of breath and wheezing.   Cardiovascular: Negative for chest pain and palpitations.  Gastrointestinal: Negative for abdominal pain, nausea and vomiting.       Objective    BP (!) 146/85 (BP Location: Left Arm, Patient Position: Sitting, Cuff Size: Large)   Pulse 78   Temp 98.4 F (36.9 C) (Oral)   Resp 16   Ht 5\' 11"  (1.803 m)   Wt 223 lb (101.2 kg)   SpO2 98%   BMI 31.10 kg/m  BP Readings from Last 3 Encounters:  08/30/20 (!) 146/85  04/22/19 129/84  11/04/18 136/72   Wt Readings from Last 3 Encounters:  08/30/20 223 lb (101.2 kg)  04/22/19 258 lb 3.2 oz (117.1 kg)  11/04/18 266 lb (120.7 kg)      Physical Exam Vitals reviewed.  Constitutional:      Appearance: He is well-developed.  HENT:     Head: Normocephalic and atraumatic.     Right Ear: External ear normal.     Left Ear: External  ear normal.  Eyes:     General: No scleral icterus. Neck:     Thyroid: No thyromegaly.  Cardiovascular:     Rate and Rhythm: Normal rate and regular rhythm.     Heart sounds: Normal heart sounds.  Pulmonary:     Effort: Pulmonary effort is normal.     Breath sounds: Normal breath sounds.  Abdominal:     Palpations: Abdomen is soft.  Lymphadenopathy:     Cervical: No cervical adenopathy.  Skin:    General: Skin is warm and dry.  Neurological:     Mental Status: He is alert and oriented to person, place, and time.  Psychiatric:        Behavior: Behavior normal.        Thought Content: Thought content normal.        Judgment:  Judgment normal.       No results found for any visits on 08/30/20.  Assessment & Plan     1. Type 2 diabetes mellitus without complication, unspecified whether long term insulin use ([place]) Patient doing very well with an intentional greater than 25 pound weight loss in the past year. - TSH - CBC w/Diff/Platelet - Comprehensive Metabolic Panel (CMET) - Lipid panel - Hemoglobin A1c  2. Hyperlipidemia, unspecified hyperlipidemia type  - TSH - CBC w/Diff/Platelet - Comprehensive Metabolic Panel (CMET) - Lipid panel - Hemoglobin A1c  3. Depression, unspecified depression type Clinically stable.  Continue venlafaxine.  6 months for physical - TSH - CBC w/Diff/Platelet - Comprehensive Metabolic Panel (CMET) - Lipid panel - Hemoglobin A1c   Return in about 6 months (around 02/27/2021).         Mathew Mullens Cranford Mon, MD  [facility] [phone] (phone) [phone] (fax)  [place]

## 2020-08-31 LAB — CBC WITH DIFFERENTIAL/PLATELET
Basophils Absolute: 0.1 10*3/uL (ref 0.0–0.2)
Basos: 1 %
EOS (ABSOLUTE): 0.2 10*3/uL (ref 0.0–0.4)
Eos: 2 %
Hematocrit: 45.5 % (ref 37.5–51.0)
Hemoglobin: 15.3 g/dL (ref 13.0–17.7)
Immature Grans (Abs): 0 10*3/uL (ref 0.0–0.1)
Immature Granulocytes: 0 %
Lymphocytes Absolute: 1.9 10*3/uL (ref 0.7–3.1)
Lymphs: 26 %
MCH: 30.7 pg (ref 26.6–33.0)
MCHC: 33.6 g/dL (ref 31.5–35.7)
MCV: 91 fL (ref 79–97)
Monocytes Absolute: 0.5 10*3/uL (ref 0.1–0.9)
Monocytes: 6 %
Neutrophils Absolute: 4.6 10*3/uL (ref 1.4–7.0)
Neutrophils: 65 %
Platelets: 226 10*3/uL (ref 150–450)
RBC: 4.99 x10E6/uL (ref 4.14–5.80)
RDW: 13.3 % (ref 11.6–15.4)
WBC: 7.2 10*3/uL (ref 3.4–10.8)

## 2020-08-31 LAB — COMPREHENSIVE METABOLIC PANEL
ALT: 20 IU/L (ref 0–44)
AST: 20 IU/L (ref 0–40)
Albumin/Globulin Ratio: 1.8 (ref 1.2–2.2)
Albumin: 4.5 g/dL (ref 3.8–4.8)
Alkaline Phosphatase: 120 IU/L (ref 44–121)
BUN/Creatinine Ratio: 14 (ref 10–24)
BUN: 17 mg/dL (ref 8–27)
Bilirubin Total: 0.3 mg/dL (ref 0.0–1.2)
CO2: 22 mmol/L (ref 20–29)
Calcium: 9.5 mg/dL (ref 8.6–10.2)
Chloride: 108 mmol/L — ABNORMAL HIGH (ref 96–106)
Creatinine, Ser: 1.24 mg/dL (ref 0.76–1.27)
GFR calc Af Amer: 69 mL/min/{1.73_m2} (ref >59)
GFR calc non Af Amer: 60 mL/min/{1.73_m2} (ref >59)
Globulin, Total: 2.5 g/dL (ref 1.5–4.5)
Glucose: 109 mg/dL — ABNORMAL HIGH (ref 65–99)
Potassium: 4.9 mmol/L (ref 3.5–5.2)
Sodium: 144 mmol/L (ref 134–144)
Total Protein: 7 g/dL (ref 6.0–8.5)

## 2020-08-31 LAB — TSH: TSH: 1.32 u[IU]/mL (ref 0.450–4.500)

## 2020-08-31 LAB — LIPID PANEL
Chol/HDL Ratio: 3.6 ratio (ref 0.0–5.0)
Cholesterol, Total: 109 mg/dL (ref 100–199)
HDL: 30 mg/dL — ABNORMAL LOW (ref >39)
LDL Chol Calc (NIH): 62 mg/dL (ref 0–99)
Triglycerides: 88 mg/dL (ref 0–149)
VLDL Cholesterol Cal: 17 mg/dL (ref 5–40)

## 2020-08-31 LAB — HEMOGLOBIN A1C
Est. average glucose Bld gHb Est-mCnc: 120 mg/dL
Hgb A1c MFr Bld: 5.8 % — ABNORMAL HIGH (ref 4.8–5.6)

## 2020-09-01 ENCOUNTER — Telehealth: Payer: Self-pay

## 2020-09-01 NOTE — Telephone Encounter (Signed)
-----   Message from Jerrol Banana., MD sent at 08/31/2020  8:03 AM EDT ----- Labs all stable.

## 2020-09-01 NOTE — Telephone Encounter (Signed)
Written by Jerrol Banana., MD on 08/31/2020 8:03 AM EDT Seen by patient Mathew Hill on 08/31/2020 1:34 PM

## 2020-09-02 ENCOUNTER — Telehealth: Payer: Self-pay | Admitting: Family Medicine

## 2020-09-02 NOTE — Telephone Encounter (Signed)
Copied from [place] [phone]. Topic: Medicare AWV >> Sep 02, 2020 11:59 AM Cher Nakai R wrote: Reason for CRM:   Left message for patient to call back and schedule Medicare Annual Wellness Visit (AWV) either virtually or in office.  No hx of AWV eligible as of 07/31/2019 awvi  please schedule at anytime with [facility] Health Advisor.

## 2020-09-03 DIAGNOSIS — H2511 Age-related nuclear cataract, right eye: Secondary | ICD-10-CM | POA: Diagnosis not present

## 2020-09-03 DIAGNOSIS — H25811 Combined forms of age-related cataract, right eye: Secondary | ICD-10-CM | POA: Diagnosis not present

## 2020-09-20 ENCOUNTER — Ambulatory Visit: Payer: Medicare Other

## 2020-09-20 DIAGNOSIS — H2512 Age-related nuclear cataract, left eye: Secondary | ICD-10-CM | POA: Diagnosis not present

## 2020-09-29 ENCOUNTER — Ambulatory Visit: Payer: Medicare Other

## 2020-10-06 NOTE — Progress Notes (Signed)
Subjective:   Mathew Hill is a 69 y.o. male who presents for an Initial Medicare Annual Wellness Visit.  I connected with Mathew Hill today by telephone and verified that I am speaking with the correct person using two identifiers. Location patient: home Location provider: work Persons participating in the virtual visit: patient, provider.   I discussed the limitations, risks, security and privacy concerns of performing an evaluation and management service by telephone and the availability of in person appointments. I also discussed with the patient that there may be a patient responsible charge related to this service. The patient expressed understanding and verbally consented to this telephonic visit.    Interactive audio and video telecommunications were attempted between this provider and patient, however failed, due to patient having technical difficulties OR patient did Mathew have access to video capability.  We continued and completed visit with audio only.   Review of Systems    N/A  Cardiac Risk Factors include: advanced age (>58men, >49 women);dyslipidemia;male gender;hypertension;smoking/ tobacco exposure;obesity (BMI >30kg/m2)     Objective:    There were no vitals filed for this visit. There is no height or weight on Hill to calculate BMI.  Advanced Directives 10/07/2020 04/20/2015 04/14/2015  Does Patient Have a Medical Advance Directive? Yes No No  Type of Paramedic of [place];Living will - -  Copy of [place] in Chart? No - copy requested - -  Would patient like information on creating a medical advance directive? - - No - patient declined information    Current Medications (verified) Outpatient Encounter Medications as of 10/07/2020  Medication Sig  . ALPRAZolam (XANAX) 1 MG tablet TAKE 1 TABLET BY MOUTH AT BEDTIME AS NEEDED FOR ANXIETY  . Cholecalciferol (VITAMIN D3) 5000 UNITS TABS Take 1 tablet by mouth daily.   . indomethacin (INDOCIN) 50 MG capsule Take 1 capsule (50 mg total) by mouth 3 (three) times daily as needed.  Marland Kitchen omeprazole (PRILOSEC) 40 MG capsule Take 1 capsule by mouth once daily  . simvastatin (ZOCOR) 40 MG tablet Take 1 tablet by mouth once daily  . venlafaxine XR (EFFEXOR-XR) 75 MG 24 hr capsule Take 3 capsules (225 mg total) by mouth daily.  . colchicine 0.6 MG tablet Take 0.6 mg by mouth 2 (two) times daily. (Patient Mathew taking: No sig reported)  . diclofenac sodium (VOLTAREN) 1 % GEL APPLY 2 GRAMS TOPICALLY 4 TIMES DAILY AS NEEDED (KNEE PAIN) (Patient Mathew taking: No sig reported)  . fluocinonide cream (LIDEX) 6.38 % Apply 1 application topically 2 (two) times daily. (Patient Mathew taking: No sig reported)  . ibuprofen (ADVIL,MOTRIN) 600 MG tablet Take 1 tablet (600 mg total) by mouth every 6 (six) hours as needed for moderate pain. (Patient Mathew taking: No sig reported)  . mometasone (ELOCON) 0.1 % cream Apply 1 application topically daily. (Patient Mathew taking: No sig reported)  . naproxen (NAPROSYN) 500 MG tablet Take 1 tablet (500 mg total) by mouth 2 (two) times daily with a meal. (Patient Mathew taking: No sig reported)  . Testosterone (ANDROGEL PUMP) 20.25 MG/ACT (1.62%) GEL 2 pumps under each daily (Patient Mathew taking: No sig reported)   No facility-administered encounter medications on Hill as of 10/07/2020.    Allergies (verified) Codeine   History: Past Medical History:  Diagnosis Date  . Cataract   . Hyperlipidemia   . Hypertension    Past Surgical History:  Procedure Laterality Date  . APPENDECTOMY    .  CARDIAC CATHETERIZATION  04/23/09  . CATARACT EXTRACTION, BILATERAL    . HERNIA REPAIR     Family History  Problem Relation Age of Onset  . Dementia Mother   . Heart attack Sister   . Depression Sister   . Heart disease Sister    Social History   Socioeconomic History  . Marital status: Married    Spouse name: Mathew Hill  . Number of children: 1  . Years  of education: Mathew Hill  . Highest education level: High school graduate  Occupational History  . Occupation: retired  Tobacco Use  . Smoking status: Current Every Day Smoker    Packs/day: 0.50    Years: 45.00    Pack years: 22.50    Types: Cigarettes  . Smokeless tobacco: Never Used  . Tobacco comment: he quit but has started back but is Mathew smoking as much.   Vaping Use  . Vaping Use: Former  . Devices: Used for 1 year  Substance and Sexual Activity  . Alcohol use: Yes    Alcohol/week: 0.0 standard drinks    Comment: 1 drink seldomly  . Drug use: No  . Sexual activity: Mathew Hill  Other Topics Concern  . Mathew Hill  Social History Narrative  . Mathew Hill   Social Determinants of Health   Financial Resource Strain: Low Risk   . Difficulty of Paying Living Expenses: Mathew hard at all  Food Insecurity: No Food Insecurity  . Worried About Charity fundraiser in the Last Year: Never true  . Ran Out of Food in the Last Year: Never true  Transportation Needs: No Transportation Needs  . Lack of Transportation (Medical): No  . Lack of Transportation (Non-Medical): No  Physical Activity: Inactive  . Days of Exercise per Week: 0 days  . Minutes of Exercise per Session: 0 min  Stress: No Stress Concern Present  . Feeling of Stress : Only a little  Social Connections: Moderately Isolated  . Frequency of Communication with Friends and Family: More than three times a week  . Frequency of Social Gatherings with Friends and Family: More than three times a week  . Attends Religious Services: Never  . Active Member of Clubs or Organizations: No  . Attends Archivist Meetings: Never  . Marital Status: Married    Tobacco Counseling Ready to quit: Yes Counseling given: No Comment: he quit but has started back but is Mathew smoking as much.    Clinical Intake:  Pre-visit preparation completed: Yes  Pain : No/denies pain     Nutritional Risks: None Diabetes:  No  How often do you need to have someone help you when you read instructions, pamphlets, or other written materials from your doctor or pharmacy?: 1 - Never  Diabetic? Prediabetic Interpreter Needed?: No  Information entered by :: [facility], LPN   Activities of Daily Living In your present state of health, do you have any difficulty performing the following activities: 10/07/2020 08/30/2020  Hearing? N N  Vision? N Y  Difficulty concentrating or making decisions? N Y  Walking or climbing stairs? N Y  Dressing or bathing? N N  Doing errands, shopping? N N  Preparing Food and eating ? N -  Using the Toilet? N -  In the past six months, have you accidently leaked urine? N -  Do you have problems with loss of bowel control? N -  Managing your Medications? N -  Managing your Finances?  N -  Housekeeping or managing your Housekeeping? N -  Some recent data might be hidden    Patient Care Team: Jerrol Banana., MD as PCP - General (Family Medicine) [place], [place] any recent Medical Services you may have received from other than [facility] providers in the past year (date may be approximate).     Assessment:   This is a routine wellness examination for Mathew Hill.  Hearing/Vision screen No exam data present  Dietary issues and exercise activities discussed: Current Exercise Habits: The patient does Mathew participate in regular exercise at present, Exercise limited by: None identified  Goals    . Quit Smoking     Recommend to continue efforts to reduce smoking habits until no longer smoking.       Depression Screen PHQ 2/9 Scores 10/07/2020 08/30/2020 02/25/2018 02/12/2017  PHQ - 2 Score 0 2 1 0  PHQ- 9 Score - 12 6 3     Fall Risk Fall Risk  10/07/2020 08/30/2020  Falls in the past year? 0 0  Number falls in past yr: 0 0  Injury with Fall? 0 0  Follow up - Falls evaluation completed    FALL RISK PREVENTION PERTAINING TO THE HOME:  Any stairs in or  around the home? Yes  If so, are there any without handrails? No  Home free of loose throw rugs in walkways, pet beds, electrical cords, etc? Yes  Adequate lighting in your home to reduce risk of falls? Yes   ASSISTIVE DEVICES UTILIZED TO PREVENT FALLS:  Life alert? No  Use of a cane, walker or w/c? No  Grab bars in the bathroom? No  Shower chair or bench in shower? Yes  Elevated toilet seat or a handicapped toilet? No    Cognitive Function:      6CIT Screen 10/07/2020  What Year? 0 points  What month? 0 points  What time? 0 points  Count back from 20 0 points  Months in reverse 0 points  Repeat phrase 0 points  Total Score 0    Immunizations Immunization History  Administered Date(s) Administered  . Hpv 08/17/2020  . Influenza, High Dose Seasonal PF 08/17/2020  . Influenza,inj,Quad PF,6+ Mos 08/20/2017  . PFIZER SARS-COV-2 Vaccination 07/27/2020, 08/17/2020  . Td 11/06/2003  . Tdap 02/22/2015    TDAP status: Up to date  Flu Vaccine status: Up to date  Pneumococcal vaccine status: Declined,  Education has been provided regarding the importance of this vaccine but patient still declined. Advised may receive this vaccine at local pharmacy or Health Dept. Aware to provide a copy of the vaccination record if obtained from local pharmacy or Health Dept. Verbalized acceptance and understanding.   Covid-19 vaccine status: Completed vaccines  Qualifies for Shingles Vaccine? Yes   Zostavax completed No   Shingrix Completed?: No.    Education has been provided regarding the importance of this vaccine. Patient has been advised to call insurance company to determine out of pocket expense if they have Mathew yet received this vaccine. Advised may also receive vaccine at local pharmacy or Health Dept. Verbalized acceptance and understanding.  Screening Tests Health Maintenance  Topic Date Due  . Hepatitis C Screening  Never done  . URINE MICROALBUMIN  Never done  . COLONOSCOPY   07/03/2017  . FOOT EXAM  02/12/2018  . PNA vac Low Risk Adult (1 of 2 - PCV13) Never done  . COVID-19 Vaccine (3 - Booster for Pfizer series) 02/15/2021  .  HEMOGLOBIN A1C  02/27/2021  . OPHTHALMOLOGY EXAM  03/19/2021  . TETANUS/TDAP  02/21/2025  . INFLUENZA VACCINE  Completed    Health Maintenance  Health Maintenance Due  Topic Date Due  . Hepatitis C Screening  Never done  . URINE MICROALBUMIN  Never done  . COLONOSCOPY  07/03/2017  . FOOT EXAM  02/12/2018  . PNA vac Low Risk Adult (1 of 2 - PCV13) Never done    Colorectal cancer screening: Currently due. Patient declined a colonoscopy referral or cologuard order at this time. Pt to follow up with PCP regarding concerns.   Lung Cancer Screening: (Low Dose CT Chest recommended if Age 45-80 years, 30 pack-year currently smoking OR have quit w/in 15years.) does qualify, however declines scan at this time.  Additional Screening:  Hepatitis C Screening: does qualify and would like this added to next blood work orders.   Vision Screening: Recommended annual ophthalmology exams for early detection of glaucoma and other disorders of the eye. Is the patient up to date with their annual eye exam?  Yes  Who is the provider or what is the name of the office in which the patient attends annual eye exams? [facility] If pt is Mathew established with a provider, would they like to be referred to a provider to establish care? No .   Dental Screening: Recommended annual dental exams for proper oral hygiene  Community Resource Referral / Chronic Care Management: CRR required this visit?  No   CCM required this visit?  No      Plan:     I have personally reviewed and noted the following in the patient's chart:   . Medical and social history . Use of alcohol, tobacco or illicit drugs  . Current medications and supplements . Functional ability and status . Nutritional status . Physical activity . Advanced directives . List of  other physicians . Hospitalizations, surgeries, and ER visits in previous 12 months . Vitals . Screenings to include cognitive, depression, and falls . Referrals and appointments  In addition, I have reviewed and discussed with patient certain preventive protocols, quality metrics, and best practice recommendations. A written personalized care plan for preventive services as well as general preventive health recommendations were provided to patient.     Mathew Hill, [place]   22/0/2542   Nurse Notes: Pt needs a foot exam, urine check, Hep c lab order and Prevnar 13 vaccine at next in office apt. Pt declined a colonoscopy referral or cologuard order at this time. Pt to discuss these options with you further at next in office apt.

## 2020-10-07 ENCOUNTER — Other Ambulatory Visit: Payer: Self-pay

## 2020-10-07 ENCOUNTER — Ambulatory Visit (INDEPENDENT_AMBULATORY_CARE_PROVIDER_SITE_OTHER): Payer: Medicare Other

## 2020-10-07 DIAGNOSIS — Z0000 Encounter for general adult medical examination without abnormal findings: Secondary | ICD-10-CM | POA: Diagnosis not present

## 2020-10-07 NOTE — Patient Instructions (Signed)
Mathew Hill , Thank you for taking time to come for your Medicare Wellness Visit. I appreciate your ongoing commitment to your health goals. Please review the following plan we discussed and let me know if I can assist you in the future.   Screening recommendations/referrals: Colonoscopy: Currently due, declined a colonoscopy referral or cologuard order at this time.  Recommended yearly ophthalmology/optometry visit for glaucoma screening and checkup Recommended yearly dental visit for hygiene and checkup  Vaccinations: Influenza vaccine: Done 08/17/20 Pneumococcal vaccine: Prevnar 13 due. Will receive at next in office apt. Tdap vaccine: Up to date, due 01/2025 Shingles vaccine: Shingrix discussed. Please contact your pharmacy for coverage information.     Advanced directives: Please bring a copy of your POA (Power of Attorney) and/or Living Will to your next appointment.   Conditions/risks identified: Smoking cessation discussed today.   Next appointment: 03/07/20 @ 9:00 AM with Dr Rosanna Randy   Preventive Care 37 Years and Older, Male Preventive care refers to lifestyle choices and visits with your health care provider that can promote health and wellness. What does preventive care include?  A yearly physical exam. This is also called an annual well check.  Dental exams once or twice a year.  Routine eye exams. Ask your health care provider how often you should have your eyes checked.  Personal lifestyle choices, including:  Daily care of your teeth and gums.  Regular physical activity.  Eating a healthy diet.  Avoiding tobacco and drug use.  Limiting alcohol use.  Practicing safe sex.  Taking low doses of aspirin every day.  Taking vitamin and mineral supplements as recommended by your health care provider. What happens during an annual well check? The services and screenings done by your health care provider during your annual well check will depend on your age, overall  health, lifestyle risk factors, and family history of disease. Counseling  Your health care provider may ask you questions about your:  Alcohol use.  Tobacco use.  Drug use.  Emotional well-being.  Home and relationship well-being.  Sexual activity.  Eating habits.  History of falls.  Memory and ability to understand (cognition).  Work and work Statistician. Screening  You may have the following tests or measurements:  Height, weight, and BMI.  Blood pressure.  Lipid and cholesterol levels. These may be checked every 5 years, or more frequently if you are over 49 years old.  Skin check.  Lung cancer screening. You may have this screening every year starting at age 11 if you have a 30-pack-year history of smoking and currently smoke or have quit within the past 15 years.  Fecal occult blood test (FOBT) of the stool. You may have this test every year starting at age 75.  Flexible sigmoidoscopy or colonoscopy. You may have a sigmoidoscopy every 5 years or a colonoscopy every 10 years starting at age 29.  Prostate cancer screening. Recommendations will vary depending on your family history and other risks.  Hepatitis C blood test.  Hepatitis B blood test.  Sexually transmitted disease (STD) testing.  Diabetes screening. This is done by checking your blood sugar (glucose) after you have not eaten for a while (fasting). You may have this done every 1-3 years.  Abdominal aortic aneurysm (AAA) screening. You may need this if you are a current or former smoker.  Osteoporosis. You may be screened starting at age 58 if you are at high risk. Talk with your health care provider about your test results, treatment options, and  if necessary, the need for more tests. Vaccines  Your health care provider may recommend certain vaccines, such as:  Influenza vaccine. This is recommended every year.  Tetanus, diphtheria, and acellular pertussis (Tdap, Td) vaccine. You may need a Td  booster every 10 years.  Zoster vaccine. You may need this after age 25.  Pneumococcal 13-valent conjugate (PCV13) vaccine. One dose is recommended after age 80.  Pneumococcal polysaccharide (PPSV23) vaccine. One dose is recommended after age 23. Talk to your health care provider about which screenings and vaccines you need and how often you need them. This information is not intended to replace advice given to you by your health care provider. Make sure you discuss any questions you have with your health care provider. Document Released: 11/12/2015 Document Revised: 07/05/2016 Document Reviewed: 08/17/2015 Elsevier Interactive Patient Education  2017 [place] Prevention in the Home Falls can cause injuries. They can happen to people of all ages. There are many things you can do to make your home safe and to help prevent falls. What can I do on the outside of my home?  Regularly fix the edges of walkways and driveways and fix any cracks.  Remove anything that might make you trip as you walk through a door, such as a raised step or threshold.  Trim any bushes or trees on the path to your home.  Use bright outdoor lighting.  Clear any walking paths of anything that might make someone trip, such as rocks or tools.  Regularly check to see if handrails are loose or broken. Make sure that both sides of any steps have handrails.  Any raised decks and porches should have guardrails on the edges.  Have any leaves, snow, or ice cleared regularly.  Use sand or salt on walking paths during winter.  Clean up any spills in your garage right away. This includes oil or grease spills. What can I do in the bathroom?  Use night lights.  Install grab bars by the toilet and in the tub and shower. Do not use towel bars as grab bars.  Use non-skid mats or decals in the tub or shower.  If you need to sit down in the shower, use a plastic, non-slip stool.  Keep the floor dry. Clean up  any water that spills on the floor as soon as it happens.  Remove soap buildup in the tub or shower regularly.  Attach bath mats securely with double-sided non-slip rug tape.  Do not have throw rugs and other things on the floor that can make you trip. What can I do in the bedroom?  Use night lights.  Make sure that you have a light by your bed that is easy to reach.  Do not use any sheets or blankets that are too big for your bed. They should not hang down onto the floor.  Have a firm chair that has side arms. You can use this for support while you get dressed.  Do not have throw rugs and other things on the floor that can make you trip. What can I do in the kitchen?  Clean up any spills right away.  Avoid walking on wet floors.  Keep items that you use a lot in easy-to-reach places.  If you need to reach something above you, use a strong step stool that has a grab bar.  Keep electrical cords out of the way.  Do not use floor polish or wax that makes floors slippery. If you  must use wax, use non-skid floor wax.  Do not have throw rugs and other things on the floor that can make you trip. What can I do with my stairs?  Do not leave any items on the stairs.  Make sure that there are handrails on both sides of the stairs and use them. Fix handrails that are broken or loose. Make sure that handrails are as long as the stairways.  Check any carpeting to make sure that it is firmly attached to the stairs. Fix any carpet that is loose or worn.  Avoid having throw rugs at the top or bottom of the stairs. If you do have throw rugs, attach them to the floor with carpet tape.  Make sure that you have a light switch at the top of the stairs and the bottom of the stairs. If you do not have them, ask someone to add them for you. What else can I do to help prevent falls?  Wear shoes that:  Do not have high heels.  Have rubber bottoms.  Are comfortable and fit you well.  Are  closed at the toe. Do not wear sandals.  If you use a stepladder:  Make sure that it is fully opened. Do not climb a closed stepladder.  Make sure that both sides of the stepladder are locked into place.  Ask someone to hold it for you, if possible.  Clearly mark and make sure that you can see:  Any grab bars or handrails.  First and last steps.  Where the edge of each step is.  Use tools that help you move around (mobility aids) if they are needed. These include:  Canes.  Walkers.  Scooters.  Crutches.  Turn on the lights when you go into a dark area. Replace any light bulbs as soon as they burn out.  Set up your furniture so you have a clear path. Avoid moving your furniture around.  If any of your floors are uneven, fix them.  If there are any pets around you, be aware of where they are.  Review your medicines with your doctor. Some medicines can make you feel dizzy. This can increase your chance of falling. Ask your doctor what other things that you can do to help prevent falls. This information is not intended to replace advice given to you by your health care provider. Make sure you discuss any questions you have with your health care provider. Document Released: 08/12/2009 Document Revised: 03/23/2016 Document Reviewed: 11/20/2014 Elsevier Interactive Patient Education  2017 [employer].

## 2020-10-27 ENCOUNTER — Other Ambulatory Visit: Payer: Self-pay | Admitting: Adult Health

## 2020-10-27 ENCOUNTER — Other Ambulatory Visit: Payer: Self-pay | Admitting: Family Medicine

## 2020-10-27 DIAGNOSIS — E7800 Pure hypercholesterolemia, unspecified: Secondary | ICD-10-CM

## 2020-10-27 DIAGNOSIS — F419 Anxiety disorder, unspecified: Secondary | ICD-10-CM

## 2020-10-27 NOTE — Telephone Encounter (Signed)
Requested medication (s) are due for refill today: no  Requested medication (s) are on the active medication list: yes  Last refill:  02/03/2020  Future visit scheduled: yes  Notes to clinic:  This refill cannot be delegated    Requested Prescriptions  Pending Prescriptions Disp Refills   ALPRAZolam (XANAX) 1 MG tablet [Pharmacy Med Name: ALPRAZolam 1 MG Oral Tablet] 30 tablet 0    Sig: TAKE 1 TABLET BY MOUTH AT BEDTIME AS NEEDED FOR ANXIETY      Not Delegated - Psychiatry:  Anxiolytics/Hypnotics Failed - 10/27/2020  9:12 PM      Failed - This refill cannot be delegated      Failed - Urine Drug Screen completed in last 360 days      Passed - Valid encounter within last 6 months    Recent Outpatient Visits           1 month ago Type 2 diabetes mellitus without complication, unspecified whether long term insulin use [facility])   [facility] Maple Hudson., MD   11 months ago Non-intractable vomiting with nausea, unspecified vomiting type   [facility] Maple Hudson., MD   1 year ago Type 2 diabetes mellitus without complication, unspecified whether long term insulin use [facility])   [facility] Maple Hudson., MD   1 year ago Type 2 diabetes mellitus without complication, unspecified whether long term insulin use [facility])   [facility] Maple Hudson., MD   2 years ago Anxiety   [facility] Maple Hudson., MD       Future Appointments             In 4 months Maple Hudson., MD [facility]

## 2020-11-03 ENCOUNTER — Telehealth: Payer: Self-pay | Admitting: *Deleted

## 2020-11-03 NOTE — Chronic Care Management (AMB) (Signed)
  Chronic Care Management   Outreach Note  11/03/2020 Name: Mathew Hill MRN: [id number] DOB: [birth year]  Mathew Hill is a 70 y.o. year old male who is a primary care patient of Maple Hudson., MD. I reached out to Mathew Hill by phone today in response to a referral sent by Mathew Hill's health plan.     An unsuccessful telephone outreach was attempted today. The patient was referred to the case management team for assistance with care management and care coordination.   Follow Up Plan: A HIPAA compliant phone message was left for the patient providing contact information and requesting a return call.  The care management team will reach out to the patient again over the next 7 days. If patient returns call to provider office, please advise to call Embedded Care Management Care Guide Gwenevere Ghazi at [phone].  Gwenevere Ghazi  Care Guide, Embedded Care Coordination [facility] Care Management  Direct Dial: [phone]

## 2020-11-09 NOTE — Chronic Care Management (AMB) (Unsigned)
  Chronic Care Management   Outreach Note  11/09/2020 Name: Mathew Hill MRN: [id number] DOB: 1951/09/18  Mathew Hill is a 70 y.o. year old male who is a primary care patient of Jerrol Banana., MD. I reached out to Mathew Hill by phone today in response to a referral sent by Mathew Hill's health plan.     A second telephone outreach was attempted today patient was not available to speak with me. The patient was referred to the case management team for assistance with care management and care coordination.   Follow Up Plan: The care management team will reach out to the patient again over the next 7 days. If patient returns call to provider office, please advise to call [name] at [phone].  [facility]

## 2020-11-12 NOTE — Chronic Care Management (AMB) (Signed)
  Chronic Care Management   Outreach Note  11/12/2020 Name: Mathew Hill MRN: [id number] DOB: February 15, 1951  Mathew Hill is a 70 y.o. year old male who is a primary care patient of Jerrol Banana., MD. I reached out to Mathew Hill by phone today in response to a referral sent by [name]'s health plan.     Third unsuccessful telephone outreach was attempted today. The patient was referred to the case management team for assistance with care management and care coordination. The patient's primary care provider has been notified of our unsuccessful attempts to make or maintain contact with the patient. The care management team is pleased to engage with this patient at any time in the future should he/she be interested in assistance from the care management team.   Follow Up Plan: A HIPAA compliant phone message was left for the patient providing contact information and requesting a return call. If patient returns call to provider office, please advise to call [place] at [phone].  [facility]

## 2020-12-06 ENCOUNTER — Other Ambulatory Visit: Payer: Self-pay | Admitting: Family Medicine

## 2020-12-06 DIAGNOSIS — F32A Depression, unspecified: Secondary | ICD-10-CM

## 2020-12-06 NOTE — Telephone Encounter (Signed)
Patient requesting venlafaxine XR (EFFEXOR-XR) 75 MG 24 hr capsule. Patient contacted pharmacy and was advised Rx is not reflected in pharmacy system and to contact PCP. Patient informed please allow 48-72 hour turn around time, patient states he is completely out.    [place] ([place]), [place] - [address] Phone:  [phone]  Fax:  [phone]

## 2020-12-06 NOTE — Telephone Encounter (Signed)
Requested medication (s) are due for refill today: Yes  Requested medication (s) are on the active medication list: Yes  Last refill:  2019  Future visit scheduled: Yes  Notes to clinic:  Unable to refill per protocol, Rx expired.     Requested Prescriptions  Pending Prescriptions Disp Refills   venlafaxine XR (EFFEXOR-XR) 75 MG 24 hr capsule 270 capsule 1    Sig: Take 3 capsules (225 mg total) by mouth daily.      Psychiatry: Antidepressants - SNRI - desvenlafaxine & venlafaxine Failed - 12/06/2020  9:31 AM      Failed - LDL in normal range and within 360 days    LDL Chol Calc (NIH)  Date Value Ref Range Status  08/30/2020 62 0 - 99 mg/dL Final          Failed - Last BP in normal range    BP Readings from Last 1 Encounters:  08/30/20 (!) 146/85          Passed - Total Cholesterol in normal range and within 360 days    Cholesterol, Total  Date Value Ref Range Status  08/30/2020 109 100 - 199 mg/dL Final          Passed - Triglycerides in normal range and within 360 days    Triglycerides  Date Value Ref Range Status  08/30/2020 88 0 - 149 mg/dL Final          Passed - Completed PHQ-2 or PHQ-9 in the last 360 days      Passed - Valid encounter within last 6 months    Recent Outpatient Visits           3 months ago Type 2 diabetes mellitus without complication, unspecified whether long term insulin use [facility])   [facility] Jerrol Banana., MD   1 year ago Non-intractable vomiting with nausea, unspecified vomiting type   [facility] Jerrol Banana., MD   1 year ago Type 2 diabetes mellitus without complication, unspecified whether long term insulin use [facility])   [facility] Jerrol Banana., MD   2 years ago Type 2 diabetes mellitus without complication, unspecified whether long term insulin use [facility])   [facility] Jerrol Banana., MD   2 years ago [place] Jerrol Banana., MD       Future Appointments             In 3 months Jerrol Banana., MD [facility] [place]

## 2020-12-07 MED ORDER — VENLAFAXINE HCL ER 75 MG PO CP24: 225.0000 mg | ORAL_CAPSULE | Freq: Every day | ORAL | 1 refills | Status: DC

## 2021-01-28 ENCOUNTER — Other Ambulatory Visit: Payer: Self-pay | Admitting: Family Medicine

## 2021-01-28 DIAGNOSIS — K219 Gastro-esophageal reflux disease without esophagitis: Secondary | ICD-10-CM

## 2021-01-28 DIAGNOSIS — E7800 Pure hypercholesterolemia, unspecified: Secondary | ICD-10-CM

## 2021-02-03 DIAGNOSIS — Z961 Presence of intraocular lens: Secondary | ICD-10-CM | POA: Diagnosis not present

## 2021-02-07 ENCOUNTER — Other Ambulatory Visit: Payer: Self-pay | Admitting: Adult Health

## 2021-02-07 DIAGNOSIS — F419 Anxiety disorder, unspecified: Secondary | ICD-10-CM

## 2021-02-07 NOTE — Telephone Encounter (Signed)
Requested medications are due for refill today.  Yes  Requested medications are on the active medications list.  yes  Last refill. 10/28/2020  Future visit scheduled.   yes  Notes to clinic.  Medication not delegated.

## 2021-02-21 DIAGNOSIS — H26493 Other secondary cataract, bilateral: Secondary | ICD-10-CM | POA: Diagnosis not present

## 2021-03-04 NOTE — Progress Notes (Deleted)
Complete physical exam   Patient: Mathew Hill   DOB: [birth year]   70 y.o. Male  MRN: [id number] Visit Date: 03/07/2021  Today's healthcare provider: [name], MD   No chief complaint on file.  Subjective    Mathew Hill is a 70 y.o. male who presents today for a complete physical exam.  He reports consuming a {diet types:17450} diet. {Exercise:19826} He generally feels {well/fairly well/poorly:18703}. He reports sleeping {well/fairly well/poorly:18703}. He {does/does not:200015} have additional problems to discuss today.  HPI  Patient had AWV with [facility] on 10/07/2020.  Past Medical History:  Diagnosis Date  . Cataract   . Hyperlipidemia   . Hypertension    Past Surgical History:  Procedure Laterality Date  . APPENDECTOMY    . CARDIAC CATHETERIZATION  04/23/09  . CATARACT EXTRACTION, BILATERAL    . HERNIA REPAIR     Social History   Socioeconomic History  . Marital status: Married    Spouse name: Not on file  . Number of children: 1  . Years of education: Not on file  . Highest education level: High school graduate  Occupational History  . Occupation: retired  Tobacco Use  . Smoking status: Current Every Day Smoker    Packs/day: 0.50    Years: 45.00    Pack years: 22.50    Types: Cigarettes  . Smokeless tobacco: Never Used  . Tobacco comment: he quit but has started back but is not smoking as much.   Vaping Use  . Vaping Use: Former  . Devices: Used for 1 year  Substance and Sexual Activity  . Alcohol use: Yes    Alcohol/week: 0.0 standard drinks    Comment: 1 drink seldomly  . Drug use: No  . Sexual activity: Not on file  Other Topics Concern  . Not on file  Social History Narrative  . Not on file   Social Determinants of Health   Financial Resource Strain: Low Risk   . Difficulty of Paying Living Expenses: Not hard at all  Food Insecurity: No Food Insecurity  . Worried About Charity fundraiser in the Last Year: Never true  .  Ran Out of Food in the Last Year: Never true  Transportation Needs: No Transportation Needs  . Lack of Transportation (Medical): No  . Lack of Transportation (Non-Medical): No  Physical Activity: Inactive  . Days of Exercise per Week: 0 days  . Minutes of Exercise per Session: 0 min  Stress: No Stress Concern Present  . Feeling of Stress : Only a little  Social Connections: Moderately Isolated  . Frequency of Communication with Friends and Family: More than three times a week  . Frequency of Social Gatherings with Friends and Family: More than three times a week  . Attends Religious Services: Never  . Active Member of Clubs or Organizations: No  . Attends Archivist Meetings: Never  . Marital Status: Married  Human resources officer Violence: Not At Risk  . Fear of Current or Ex-Partner: No  . Emotionally Abused: No  . Physically Abused: No  . Sexually Abused: No   Family Status  Relation Name Status  . Mother  Alive  . Sister  Alive  . Father  Deceased at age 10       natural causes   Family History  Problem Relation Age of Onset  . Dementia Mother   . Heart attack Sister   . Depression Sister   . Heart disease  Sister    Allergies  Allergen Reactions  . Codeine Other (See Comments)    GI Upset GI Upset GI Upset    Patient Care Team: Jerrol Banana., MD as PCP - General (Family Medicine) Pa, [facility]   Medications: Outpatient Medications Prior to Visit  Medication Sig  . ALPRAZolam (XANAX) 1 MG tablet TAKE 1 TABLET BY MOUTH AT BEDTIME AS NEEDED FOR ANXIETY  . Cholecalciferol (VITAMIN D3) 5000 UNITS TABS Take 1 tablet by mouth daily.  . colchicine 0.6 MG tablet Take 0.6 mg by mouth 2 (two) times daily. (Patient not taking: No sig reported)  . diclofenac sodium (VOLTAREN) 1 % GEL APPLY 2 GRAMS TOPICALLY 4 TIMES DAILY AS NEEDED (KNEE PAIN) (Patient not taking: No sig reported)  . fluocinonide cream (LIDEX) 5.78 % Apply 1 application topically  2 (two) times daily. (Patient not taking: No sig reported)  . ibuprofen (ADVIL,MOTRIN) 600 MG tablet Take 1 tablet (600 mg total) by mouth every 6 (six) hours as needed for moderate pain. (Patient not taking: No sig reported)  . indomethacin (INDOCIN) 50 MG capsule Take 1 capsule (50 mg total) by mouth 3 (three) times daily as needed.  . mometasone (ELOCON) 0.1 % cream Apply 1 application topically daily. (Patient not taking: No sig reported)  . naproxen (NAPROSYN) 500 MG tablet Take 1 tablet (500 mg total) by mouth 2 (two) times daily with a meal. (Patient not taking: No sig reported)  . omeprazole (PRILOSEC) 40 MG capsule Take 1 capsule by mouth once daily  . simvastatin (ZOCOR) 40 MG tablet Take 1 tablet by mouth once daily  . Testosterone (ANDROGEL PUMP) 20.25 MG/ACT (1.62%) GEL 2 pumps under each daily (Patient not taking: No sig reported)  . venlafaxine XR (EFFEXOR-XR) 75 MG 24 hr capsule Take 3 capsules (225 mg total) by mouth daily.   No facility-administered medications prior to visit.    Review of Systems  {Labs  Heme  Chem  Endocrine  Serology  Results Review (optional):23779::" "}  Objective    There were no vitals taken for this visit. {Show previous vital signs (optional):23777::" "}  Physical Exam  ***  Last depression screening scores PHQ 2/9 Scores 10/07/2020 08/30/2020 02/25/2018  PHQ - 2 Score 0 2 1  PHQ- 9 Score - 12 6   Last fall risk screening Fall Risk  10/07/2020  Falls in the past year? 0  Number falls in past yr: 0  Injury with Fall? 0  Follow up -   Last Audit-C alcohol use screening Alcohol Use Disorder Test (AUDIT) 10/07/2020  1. How often do you have a drink containing alcohol? 1  2. How many drinks containing alcohol do you have on a typical day when you are drinking? 0  3. How often do you have six or more drinks on one occasion? 0  AUDIT-C Score 1  Alcohol Brief Interventions/Follow-up AUDIT Score <7 follow-up not indicated   A score of 3  or more in women, and 4 or more in men indicates increased risk for alcohol abuse, EXCEPT if all of the points are from question 1   No results found for any visits on 03/07/21.  Assessment & Plan    Routine Health Maintenance and Physical Exam  Exercise Activities and Dietary recommendations Goals    . Quit Smoking     Recommend to continue efforts to reduce smoking habits until no longer smoking.        Immunization History  Administered Date(s) Administered  . Hpv-Unspecified 08/17/2020  . Influenza, High Dose Seasonal PF 08/17/2020  . Influenza,inj,Quad PF,6+ Mos 08/20/2017  . PFIZER(Purple Top)SARS-COV-2 Vaccination 07/27/2020, 08/17/2020  . Td 11/06/2003  . Tdap 02/22/2015    Health Maintenance  Topic Date Due  . URINE MICROALBUMIN  Never done  . Hepatitis C Screening  Never done  . COLONOSCOPY (Pts 45-34yrs Insurance coverage will need to be confirmed)  07/03/2017  . FOOT EXAM  02/12/2018  . PNA vac Low Risk Adult (1 of 2 - PCV13) Never done  . COVID-19 Vaccine (3 - Booster for Pfizer series) 02/15/2021  . HEMOGLOBIN A1C  02/27/2021  . OPHTHALMOLOGY EXAM  03/19/2021  . INFLUENZA VACCINE  05/30/2021  . TETANUS/TDAP  02/21/2025  . HPV VACCINES  Aged Out    Discussed health benefits of physical activity, and encouraged him to engage in regular exercise appropriate for his age and condition.  ***  No follow-ups on file.     {provider attestation***:1}   [name], MD  [facility] [phone] (phone) [phone] (fax)  [place]

## 2021-03-07 ENCOUNTER — Encounter: Payer: Self-pay | Admitting: Family Medicine

## 2021-03-07 DIAGNOSIS — Z0000 Encounter for general adult medical examination without abnormal findings: Secondary | ICD-10-CM

## 2021-03-07 DIAGNOSIS — E119 Type 2 diabetes mellitus without complications: Secondary | ICD-10-CM

## 2021-03-07 DIAGNOSIS — E785 Hyperlipidemia, unspecified: Secondary | ICD-10-CM

## 2021-07-19 ENCOUNTER — Ambulatory Visit: Payer: Self-pay

## 2021-07-19 NOTE — Telephone Encounter (Signed)
Pt called to report that he has had diarrhea for the last two days. No other symptoms reported   Pt. Reports he has loose stools x 2 days. This is only symptom. Does not feel ill. Has appointment tomorrow for physical. Ok for pt. To keep appointment per Nunzio Cory in the practice. Answer Assessment - Initial Assessment Questions 1. DIARRHEA SEVERITY: "How bad is the diarrhea?" "How many more stools have you had in the past 24 hours than normal?"    - NO DIARRHEA (SCALE 0)   - MILD (SCALE 1-3): Few loose or mushy BMs; increase of 1-3 stools over normal daily number of stools; mild increase in ostomy output.   -  MODERATE (SCALE 4-7): Increase of 4-6 stools daily over normal; moderate increase in ostomy output. * SEVERE (SCALE 8-10; OR 'WORST POSSIBLE'): Increase of 7 or more stools daily over normal; moderate increase in ostomy output; incontinence.     4 2. ONSET: "When did the diarrhea begin?"      2 days ago 3. BM CONSISTENCY: "How loose or watery is the diarrhea?"      Watery 4. VOMITING: "Are you also vomiting?" If Yes, ask: "How many times in the past 24 hours?"      No 5. ABDOMINAL PAIN: "Are you having any abdominal pain?" If Yes, ask: "What does it feel like?" (e.g., crampy, dull, intermittent, constant)      No 6. ABDOMINAL PAIN SEVERITY: If present, ask: "How bad is the pain?"  (e.g., Scale 1-10; mild, moderate, or severe)   - MILD (1-3): doesn't interfere with normal activities, abdomen soft and not tender to touch    - MODERATE (4-7): interferes with normal activities or awakens from sleep, abdomen tender to touch    - SEVERE (8-10): excruciating pain, doubled over, unable to do any normal activities       None 7. ORAL INTAKE: If vomiting, "Have you been able to drink liquids?" "How much liquids have you had in the past 24 hours?"     Yes 8. HYDRATION: "Any signs of dehydration?" (e.g., dry mouth [not just dry lips], too weak to stand, dizziness, new weight loss) "When did you last  urinate?"     No 9. EXPOSURE: "Have you traveled to a foreign country recently?" "Have you been exposed to anyone with diarrhea?" "Could you have eaten any food that was spoiled?"     No 10. ANTIBIOTIC USE: "Are you taking antibiotics now or have you taken antibiotics in the past 2 months?"       No 11. OTHER SYMPTOMS: "Do you have any other symptoms?" (e.g., fever, blood in stool)       No 12. PREGNANCY: "Is there any chance you are pregnant?" "When was your last menstrual period?"       N/A  Protocols used: [facility]

## 2021-07-19 NOTE — Telephone Encounter (Signed)
FYI. Please review.

## 2021-07-20 ENCOUNTER — Ambulatory Visit (INDEPENDENT_AMBULATORY_CARE_PROVIDER_SITE_OTHER): Payer: Medicare Other | Admitting: Family Medicine

## 2021-07-20 ENCOUNTER — Encounter: Payer: Self-pay | Admitting: Family Medicine

## 2021-07-20 ENCOUNTER — Other Ambulatory Visit: Payer: Self-pay

## 2021-07-20 VITALS — BP 148/81 | HR 94 | Temp 99.0°F | Resp 16 | Ht 71.0 in | Wt 224.0 lb

## 2021-07-20 DIAGNOSIS — Z0000 Encounter for general adult medical examination without abnormal findings: Secondary | ICD-10-CM

## 2021-07-20 DIAGNOSIS — R198 Other specified symptoms and signs involving the digestive system and abdomen: Secondary | ICD-10-CM

## 2021-07-20 DIAGNOSIS — H6121 Impacted cerumen, right ear: Secondary | ICD-10-CM | POA: Diagnosis not present

## 2021-07-20 DIAGNOSIS — E119 Type 2 diabetes mellitus without complications: Secondary | ICD-10-CM | POA: Diagnosis not present

## 2021-07-20 DIAGNOSIS — Z23 Encounter for immunization: Secondary | ICD-10-CM

## 2021-07-20 DIAGNOSIS — R030 Elevated blood-pressure reading, without diagnosis of hypertension: Secondary | ICD-10-CM | POA: Diagnosis not present

## 2021-07-20 DIAGNOSIS — E785 Hyperlipidemia, unspecified: Secondary | ICD-10-CM | POA: Diagnosis not present

## 2021-07-20 DIAGNOSIS — Z1211 Encounter for screening for malignant neoplasm of colon: Secondary | ICD-10-CM

## 2021-07-20 DIAGNOSIS — Z86010 Personal history of colonic polyps: Secondary | ICD-10-CM

## 2021-07-20 MED ORDER — TELMISARTAN 20 MG PO TABS: 20.0000 mg | ORAL_TABLET | Freq: Every day | ORAL | 11 refills | Status: DC

## 2021-07-20 MED ORDER — NA SULFATE-K SULFATE-MG SULF 17.5-3.13-1.6 GM/177ML PO SOLN: 1.0000 | Freq: Once | ORAL | 0 refills | Status: AC

## 2021-07-20 NOTE — Progress Notes (Signed)
Complete physical exam   Patient: Mathew Hill   DOB: 1951-04-08   70 y.o. Male  MRN: [id number] Visit Date: 07/20/2021  Today's healthcare provider: [name], MD   Chief Complaint  Patient presents with   Annual Exam   Subjective    Mathew Hill is a 70 y.o. male who presents today for a complete physical exam.  He reports consuming a general diet. The patient does not participate in regular exercise at present. He generally feels well. He reports sleeping well. He does not have additional problems to discuss today.  Patient has been retired for 3 years. His only issue is that of alternating constipation and diarrhea.   Past Medical History:  Diagnosis Date   Cataract    Hyperlipidemia    Hypertension    Past Surgical History:  Procedure Laterality Date   APPENDECTOMY     CARDIAC CATHETERIZATION  04/23/09   CATARACT EXTRACTION, BILATERAL     HERNIA REPAIR     Social History   Socioeconomic History   Marital status: Married    Spouse name: Not on file   Number of children: 1   Years of education: Not on file   Highest education level: High school graduate  Occupational History   Occupation: retired  Tobacco Use   Smoking status: Every Day    Packs/day: 0.50    Years: 45.00    Pack years: 22.50    Types: Cigarettes   Smokeless tobacco: Never   Tobacco comments:    he quit but has started back but is not smoking as much.   Vaping Use   Vaping Use: Former   Devices: Used for 1 year  Substance and Sexual Activity   Alcohol use: Yes    Alcohol/week: 0.0 standard drinks    Comment: 1 drink seldomly   Drug use: No   Sexual activity: Not on file  Other Topics Concern   Not on file  Social History Narrative   Not on file   Social Determinants of Health   Financial Resource Strain: Low Risk    Difficulty of Paying Living Expenses: Not hard at all  Food Insecurity: No Food Insecurity   Worried About Charity fundraiser in the Last  Year: Never true   [place] in the Last Year: Never true  Transportation Needs: No Transportation Needs   Lack of Transportation (Medical): No   Lack of Transportation (Non-Medical): No  Physical Activity: Inactive   Days of Exercise per Week: 0 days   Minutes of Exercise per Session: 0 min  Stress: No Stress Concern Present   Feeling of Stress : Only a little  Social Connections: Moderately Isolated   Frequency of Communication with Friends and Family: More than three times a week   Frequency of Social Gatherings with Friends and Family: More than three times a week   Attends Religious Services: Never   Marine scientist or Organizations: No   Attends Music therapist: Never   Marital Status: Married  Human resources officer Violence: Not At Risk   Fear of Current or Ex-Partner: No   Emotionally Abused: No   Physically Abused: No   Sexually Abused: No   Family Status  Relation Name Status   Mother  Alive   Sister  Alive   Father  Deceased at age 33       natural causes   Family History  Problem Relation Age of  Onset   Dementia Mother    Heart attack Sister    Depression Sister    Heart disease Sister    Allergies  Allergen Reactions   Codeine Other (See Comments)    GI Upset GI Upset GI Upset    Patient Care Team: Jerrol Banana., MD as PCP - General (Family Medicine) Pa, [facility]   Medications: Outpatient Medications Prior to Visit  Medication Sig   ALPRAZolam (XANAX) 1 MG tablet TAKE 1 TABLET BY MOUTH AT BEDTIME AS NEEDED FOR ANXIETY   Cholecalciferol (VITAMIN D3) 5000 UNITS TABS Take 1 tablet by mouth daily.   indomethacin (INDOCIN) 50 MG capsule Take 1 capsule (50 mg total) by mouth 3 (three) times daily as needed.   omeprazole (PRILOSEC) 40 MG capsule Take 1 capsule by mouth once daily   simvastatin (ZOCOR) 40 MG tablet Take 1 tablet by mouth once daily   venlafaxine XR (EFFEXOR-XR) 75 MG 24 hr capsule Take 3  capsules (225 mg total) by mouth daily.   colchicine 0.6 MG tablet Take 0.6 mg by mouth 2 (two) times daily. (Patient not taking: No sig reported)   diclofenac sodium (VOLTAREN) 1 % GEL APPLY 2 GRAMS TOPICALLY 4 TIMES DAILY AS NEEDED (KNEE PAIN) (Patient not taking: No sig reported)   fluocinonide cream (LIDEX) 1.61 % Apply 1 application topically 2 (two) times daily. (Patient not taking: No sig reported)   ibuprofen (ADVIL,MOTRIN) 600 MG tablet Take 1 tablet (600 mg total) by mouth every 6 (six) hours as needed for moderate pain. (Patient not taking: No sig reported)   mometasone (ELOCON) 0.1 % cream Apply 1 application topically daily. (Patient not taking: No sig reported)   naproxen (NAPROSYN) 500 MG tablet Take 1 tablet (500 mg total) by mouth 2 (two) times daily with a meal. (Patient not taking: No sig reported)   Testosterone (ANDROGEL PUMP) 20.25 MG/ACT (1.62%) GEL 2 pumps under each daily (Patient not taking: No sig reported)   No facility-administered medications prior to visit.    Review of Systems  All other systems reviewed and are negative.     Objective    BP (!) 148/81   Pulse 94   Temp 99 F (37.2 C)   Resp 16   Ht 5\' 11"  (1.803 m)   Wt 224 lb (101.6 kg)   BMI 31.24 kg/m  BP Readings from Last 3 Encounters:  07/20/21 (!) 148/81  08/30/20 (!) 146/85  04/22/19 129/84   Wt Readings from Last 3 Encounters:  07/20/21 224 lb (101.6 kg)  08/30/20 223 lb (101.2 kg)  04/22/19 258 lb 3.2 oz (117.1 kg)      Physical Exam Vitals reviewed.  Constitutional:      Appearance: He is well-developed.  HENT:     Head: Normocephalic and atraumatic.     Right Ear: External ear normal.     Left Ear: External ear normal.  Eyes:     General: No scleral icterus. Neck:     Thyroid: No thyromegaly.  Cardiovascular:     Rate and Rhythm: Normal rate and regular rhythm.     Heart sounds: Normal heart sounds.  Pulmonary:     Effort: Pulmonary effort is normal.     Breath  sounds: Normal breath sounds.  Abdominal:     Palpations: Abdomen is soft.  Lymphadenopathy:     Cervical: No cervical adenopathy.  Skin:    General: Skin is warm and dry.  Neurological:  General: No focal deficit present.     Mental Status: He is alert and oriented to person, place, and time.  Psychiatric:        Mood and Affect: Mood normal.        Behavior: Behavior normal.        Thought Content: Thought content normal.        Judgment: Judgment normal.      Last depression screening scores PHQ 2/9 Scores 10/07/2020 08/30/2020 02/25/2018  PHQ - 2 Score 0 2 1  PHQ- 9 Score - 12 6   Last fall risk screening Fall Risk  10/07/2020  Falls in the past year? 0  Number falls in past yr: 0  Injury with Fall? 0  Follow up -   Last Audit-C alcohol use screening Alcohol Use Disorder Test (AUDIT) 07/20/2021  1. How often do you have a drink containing alcohol? 0  2. How many drinks containing alcohol do you have on a typical day when you are drinking? 0  3. How often do you have six or more drinks on one occasion? 0  AUDIT-C Score 0  Alcohol Brief Interventions/Follow-up -   A score of 3 or more in women, and 4 or more in men indicates increased risk for alcohol abuse, EXCEPT if all of the points are from question 1   No results found for any visits on 07/20/21.  Assessment & Plan    Routine Health Maintenance and Physical Exam  Exercise Activities and Dietary recommendations  Goals      Quit Smoking     Recommend to continue efforts to reduce smoking habits until no longer smoking.         Immunization History  Administered Date(s) Administered   Hpv-Unspecified 08/17/2020   Influenza, High Dose Seasonal PF 08/17/2020   Influenza,inj,Quad PF,6+ Mos 08/20/2017   PFIZER(Purple Top)SARS-COV-2 Vaccination 07/27/2020, 08/17/2020   Td 11/06/2003   Tdap 02/22/2015    Health Maintenance  Topic Date Due   URINE MICROALBUMIN  Never done   Hepatitis C Screening  Never  done   Zoster Vaccines- Shingrix (1 of 2) Never done   COLONOSCOPY (Pts 45-72yrs Insurance coverage will need to be confirmed)  07/03/2017   FOOT EXAM  02/12/2018   COVID-19 Vaccine (3 - Booster for Pfizer series) 01/15/2021   HEMOGLOBIN A1C  02/27/2021   OPHTHALMOLOGY EXAM  03/19/2021   INFLUENZA VACCINE  05/30/2021   TETANUS/TDAP  02/21/2025   HPV VACCINES  Aged Out    Discussed health benefits of physical activity, and encouraged him to engage in regular exercise appropriate for his age and condition.  1. Annual physical exam Patient past due for screening colonoscopy.  2. Type 2 diabetes mellitus without complication, unspecified whether long term insulin use (HCC)  - Hemoglobin A1c  3. Hyperlipidemia, unspecified hyperlipidemia type  - Lipid panel - TSH  4. Encounter for Medicare annual wellness exam   5. Elevated BP without diagnosis of hypertension  - CBC with Differential/Platelet - Comprehensive metabolic panel - telmisartan (MICARDIS) 20 MG tablet; Take 1 tablet (20 mg total) by mouth daily.  Dispense: 30 tablet; Refill: 11  6. Alternating constipation and diarrhea Tried Metamucil for a month and then if it does not help we will add a probiotic.  7. Colon cancer screening Patient past due. - Ambulatory referral to Gastroenterology  8. Need for influenza vaccination  - Flu Vaccine QUAD High Dose(Fluad)  9. Impacted cerumen of right ear     No  follow-ups on file.     I, [name], MD, have reviewed all documentation for this visit. The documentation on 07/23/21 for the exam, diagnosis, procedures, and orders are all accurate and complete.    Shadai Mcclane Cranford Mon, MD  [facility] [phone] (phone) [phone] (fax)  [place]

## 2021-07-20 NOTE — Progress Notes (Signed)
Gastroenterology Pre-Procedure Review  Request Date: 08/18/21 Requesting Physician: Dr. Marius Ditch  PATIENT REVIEW QUESTIONS: The patient responded to the following health history questions as indicated:    1. Are you having any GI issues? yes (diarrhea, constipation. Pt states he saw his provider today and he told him take some over the counter meds until issue stops.) 2. Do you have a personal history of Polyps? yes (07/04/2007) 3. Do you have a family history of Colon Cancer or Polyps? no 4. Diabetes Mellitus?  prediabetes 5. Joint replacements in the past 12 months?no 6. Major health problems in the past 3 months?no 7. Any artificial heart valves, MVP, or defibrillator?no    MEDICATIONS & ALLERGIES:    Patient reports the following regarding taking any anticoagulation/antiplatelet therapy:   Plavix, Coumadin, Eliquis, Xarelto, Lovenox, Pradaxa, Brilinta, or Effient? no Aspirin? no  Patient confirms/reports the following medications:  Current Outpatient Medications  Medication Sig Dispense Refill   ALPRAZolam (XANAX) 1 MG tablet TAKE 1 TABLET BY MOUTH AT BEDTIME AS NEEDED FOR ANXIETY 30 tablet 0   Cholecalciferol (VITAMIN D3) 5000 UNITS TABS Take 1 tablet by mouth daily.     colchicine 0.6 MG tablet Take 0.6 mg by mouth 2 (two) times daily. (Patient not taking: No sig reported)     diclofenac sodium (VOLTAREN) 1 % GEL APPLY 2 GRAMS TOPICALLY 4 TIMES DAILY AS NEEDED (KNEE PAIN) (Patient not taking: No sig reported)     fluocinonide cream (LIDEX) 6.41 % Apply 1 application topically 2 (two) times daily. (Patient not taking: No sig reported) 15 g 0   ibuprofen (ADVIL,MOTRIN) 600 MG tablet Take 1 tablet (600 mg total) by mouth every 6 (six) hours as needed for moderate pain. (Patient not taking: No sig reported) 30 tablet 0   indomethacin (INDOCIN) 50 MG capsule Take 1 capsule (50 mg total) by mouth 3 (three) times daily as needed. 270 capsule 1   mometasone (ELOCON) 0.1 % cream Apply 1  application topically daily. (Patient not taking: No sig reported) 45 g 0   naproxen (NAPROSYN) 500 MG tablet Take 1 tablet (500 mg total) by mouth 2 (two) times daily with a meal. (Patient not taking: No sig reported) 30 tablet 1   omeprazole (PRILOSEC) 40 MG capsule Take 1 capsule by mouth once daily 90 capsule 0   simvastatin (ZOCOR) 40 MG tablet Take 1 tablet by mouth once daily 90 tablet 0   telmisartan (MICARDIS) 20 MG tablet Take 1 tablet (20 mg total) by mouth daily. 30 tablet 11   Testosterone (ANDROGEL PUMP) 20.25 MG/ACT (1.62%) GEL 2 pumps under each daily (Patient not taking: No sig reported) 75 g 3   venlafaxine XR (EFFEXOR-XR) 75 MG 24 hr capsule Take 3 capsules (225 mg total) by mouth daily. 270 capsule 1   No current facility-administered medications for this visit.    Patient confirms/reports the following allergies:  Allergies  Allergen Reactions   Codeine Other (See Comments)    GI Upset GI Upset GI Upset    No orders of the defined types were placed in this encounter.   AUTHORIZATION INFORMATION Primary Insurance: 1D#: Group #:  Secondary Insurance: 1D#: Group #:  SCHEDULE INFORMATION: Date: 08/18/21 Time: Location: [place]

## 2021-07-20 NOTE — Patient Instructions (Signed)
Start Metamucil.   Start OTC probiotics in November.

## 2021-07-21 LAB — COMPREHENSIVE METABOLIC PANEL
ALT: 11 IU/L (ref 0–44)
AST: 14 IU/L (ref 0–40)
Albumin/Globulin Ratio: 1.9 (ref 1.2–2.2)
Albumin: 4.8 g/dL (ref 3.8–4.8)
Alkaline Phosphatase: 125 IU/L — ABNORMAL HIGH (ref 44–121)
BUN/Creatinine Ratio: 12 (ref 10–24)
BUN: 16 mg/dL (ref 8–27)
Bilirubin Total: 0.5 mg/dL (ref 0.0–1.2)
CO2: 22 mmol/L (ref 20–29)
Calcium: 10.1 mg/dL (ref 8.6–10.2)
Chloride: 104 mmol/L (ref 96–106)
Creatinine, Ser: 1.35 mg/dL — ABNORMAL HIGH (ref 0.76–1.27)
Globulin, Total: 2.5 g/dL (ref 1.5–4.5)
Glucose: 113 mg/dL — ABNORMAL HIGH (ref 65–99)
Potassium: 4.5 mmol/L (ref 3.5–5.2)
Sodium: 144 mmol/L (ref 134–144)
Total Protein: 7.3 g/dL (ref 6.0–8.5)
eGFR: 58 mL/min/{1.73_m2} — ABNORMAL LOW (ref >59)

## 2021-07-21 LAB — CBC WITH DIFFERENTIAL/PLATELET
Basophils Absolute: 0.1 10*3/uL (ref 0.0–0.2)
Basos: 1 %
EOS (ABSOLUTE): 0.1 10*3/uL (ref 0.0–0.4)
Eos: 1 %
Hematocrit: 47 % (ref 37.5–51.0)
Hemoglobin: 15.8 g/dL (ref 13.0–17.7)
Immature Grans (Abs): 0 10*3/uL (ref 0.0–0.1)
Immature Granulocytes: 0 %
Lymphocytes Absolute: 1.8 10*3/uL (ref 0.7–3.1)
Lymphs: 19 %
MCH: 30.8 pg (ref 26.6–33.0)
MCHC: 33.6 g/dL (ref 31.5–35.7)
MCV: 92 fL (ref 79–97)
Monocytes Absolute: 0.6 10*3/uL (ref 0.1–0.9)
Monocytes: 7 %
Neutrophils Absolute: 6.8 10*3/uL (ref 1.4–7.0)
Neutrophils: 72 %
Platelets: 295 10*3/uL (ref 150–450)
RBC: 5.13 x10E6/uL (ref 4.14–5.80)
RDW: 13.8 % (ref 11.6–15.4)
WBC: 9.4 10*3/uL (ref 3.4–10.8)

## 2021-07-21 LAB — HEMOGLOBIN A1C
Est. average glucose Bld gHb Est-mCnc: 126 mg/dL
Hgb A1c MFr Bld: 6 % — ABNORMAL HIGH (ref 4.8–5.6)

## 2021-07-21 LAB — LIPID PANEL
Chol/HDL Ratio: 4.1 ratio (ref 0.0–5.0)
Cholesterol, Total: 126 mg/dL (ref 100–199)
HDL: 31 mg/dL — ABNORMAL LOW (ref >39)
LDL Chol Calc (NIH): 73 mg/dL (ref 0–99)
Triglycerides: 123 mg/dL (ref 0–149)
VLDL Cholesterol Cal: 22 mg/dL (ref 5–40)

## 2021-07-21 LAB — TSH: TSH: 0.797 u[IU]/mL (ref 0.450–4.500)

## 2021-08-01 ENCOUNTER — Encounter: Payer: Self-pay | Admitting: Gastroenterology

## 2021-08-18 ENCOUNTER — Other Ambulatory Visit: Payer: Self-pay

## 2021-08-18 ENCOUNTER — Ambulatory Visit: Payer: Medicare Other | Admitting: Anesthesiology

## 2021-08-18 ENCOUNTER — Ambulatory Visit
Admission: RE | Admit: 2021-08-18 | Discharge: 2021-08-18 | Disposition: A | Discharge status: Home / Self Care | Payer: Medicare Other | Attending: Gastroenterology | Admitting: Gastroenterology

## 2021-08-18 ENCOUNTER — Encounter
Admission: RE | Disposition: A | Discharge status: Home / Self Care | Payer: Self-pay | Source: Home / Self Care | Attending: Gastroenterology

## 2021-08-18 ENCOUNTER — Encounter: Payer: Self-pay | Admitting: Gastroenterology

## 2021-08-18 DIAGNOSIS — F17210 Nicotine dependence, cigarettes, uncomplicated: Secondary | ICD-10-CM | POA: Diagnosis not present

## 2021-08-18 DIAGNOSIS — I10 Essential (primary) hypertension: Secondary | ICD-10-CM | POA: Insufficient documentation

## 2021-08-18 DIAGNOSIS — Z791 Long term (current) use of non-steroidal anti-inflammatories (NSAID): Secondary | ICD-10-CM | POA: Diagnosis not present

## 2021-08-18 DIAGNOSIS — K219 Gastro-esophageal reflux disease without esophagitis: Secondary | ICD-10-CM | POA: Diagnosis not present

## 2021-08-18 DIAGNOSIS — Z79899 Other long term (current) drug therapy: Secondary | ICD-10-CM | POA: Insufficient documentation

## 2021-08-18 DIAGNOSIS — D122 Benign neoplasm of ascending colon: Secondary | ICD-10-CM

## 2021-08-18 DIAGNOSIS — D123 Benign neoplasm of transverse colon: Secondary | ICD-10-CM

## 2021-08-18 DIAGNOSIS — Z1211 Encounter for screening for malignant neoplasm of colon: Secondary | ICD-10-CM | POA: Insufficient documentation

## 2021-08-18 DIAGNOSIS — E785 Hyperlipidemia, unspecified: Secondary | ICD-10-CM | POA: Insufficient documentation

## 2021-08-18 DIAGNOSIS — Z860100 Personal history of colon polyps, unspecified: Secondary | ICD-10-CM

## 2021-08-18 DIAGNOSIS — K635 Polyp of colon: Secondary | ICD-10-CM | POA: Diagnosis not present

## 2021-08-18 DIAGNOSIS — K648 Other hemorrhoids: Secondary | ICD-10-CM | POA: Diagnosis not present

## 2021-08-18 HISTORY — DX: Prediabetes: R73.03

## 2021-08-18 HISTORY — PX: POLYPECTOMY: SHX5525

## 2021-08-18 HISTORY — DX: Presence of dental prosthetic device (complete) (partial): Z97.2

## 2021-08-18 HISTORY — PX: COLONOSCOPY WITH PROPOFOL: SHX5780

## 2021-08-18 SURGERY — COLONOSCOPY WITH PROPOFOL
Anesthesia: General | Site: Rectum

## 2021-08-18 MED ORDER — LIDOCAINE HCL (CARDIAC) PF 100 MG/5ML IV SOSY
PREFILLED_SYRINGE | INTRAVENOUS | Status: DC | PRN
  Administered 2021-08-18: 30 mg via INTRAVENOUS

## 2021-08-18 MED ORDER — STERILE WATER FOR IRRIGATION IR SOLN
Status: DC | PRN
  Administered 2021-08-18: 1

## 2021-08-18 MED ORDER — SODIUM CHLORIDE 0.9 % IV SOLN: INTRAVENOUS | Status: DC

## 2021-08-18 MED ORDER — LACTATED RINGERS IV SOLN: INTRAVENOUS | Status: DC

## 2021-08-18 MED ORDER — PROPOFOL 10 MG/ML IV BOLUS
INTRAVENOUS | Status: DC | PRN
  Administered 2021-08-18: 20 mg via INTRAVENOUS
  Administered 2021-08-18: 150 mg via INTRAVENOUS
  Administered 2021-08-18 (×5): 20 mg via INTRAVENOUS
  Administered 2021-08-18: 30 mg via INTRAVENOUS
  Administered 2021-08-18 (×2): 20 mg via INTRAVENOUS

## 2021-08-18 SURGICAL SUPPLY — 17 items
CLIP HMST 235XBRD CATH ROT (MISCELLANEOUS) ×3 IMPLANT
CLIP RESOLUTION 360 11X235 (MISCELLANEOUS) ×6
ELECT REM PT RETURN 9FT ADLT (ELECTROSURGICAL) ×2
ELECTRODE REM PT RTRN 9FT ADLT (ELECTROSURGICAL) ×1 IMPLANT
ELEVIEW  INJECTABLE COMP 10 (MISCELLANEOUS) ×1
GOWN CVR UNV OPN BCK APRN NK (MISCELLANEOUS) ×2 IMPLANT
GOWN ISOL THUMB LOOP REG UNIV (MISCELLANEOUS) ×4
INJECTABLE ELEVIEW COMP 10 (MISCELLANEOUS) ×1 IMPLANT
MANIFOLD NEPTUNE II (INSTRUMENTS) ×2 IMPLANT
MARKER SPOT ENDO TATTOO 5ML (MISCELLANEOUS) ×1 IMPLANT
NEEDLE CARR LOCKE SCLERO (NEEDLE) ×2 IMPLANT
RETRIEVER NET ROTH 2.5X230 LF (MISCELLANEOUS) ×2 IMPLANT
SNARE LASSO HEX 3 IN 1 (INSTRUMENTS) ×2 IMPLANT
SPOT EX ENDOSCOPIC TATTOO (MISCELLANEOUS) ×1
SYR 10ML LL (SYRINGE) ×2 IMPLANT
TRAP ETRAP POLY (MISCELLANEOUS) ×2 IMPLANT
WATER STERILE IRR 250ML POUR (IV SOLUTION) ×2 IMPLANT

## 2021-08-18 NOTE — Op Note (Signed)
[facility] Patient Name: Mathew Hill Procedure Date: 08/18/2021 8:11 AM MRN: [id number] Account #: [id number] Date of Birth: 06/17/1951 Admit Type: Outpatient Age: 70 Room: [facility] OR ROOM 01 Gender: Male Note Status: Finalized Instrument Name: [id number] Procedure:             Colonoscopy Indications:           Surveillance: Personal history of adenomatous polyps                         on last colonoscopy > 5 years ago, Last colonoscopy:                         September 2008 Providers:             Lin Landsman MD, MD Referring MD:          Janine Ores. Rosanna Randy, MD (Referring MD) Medicines:             General Anesthesia Complications:         No immediate complications. Estimated blood loss: None. Procedure:             Pre-Anesthesia Assessment:                        - Prior to the procedure, a History and Physical was                         performed, and patient medications and allergies were                         reviewed. The patient is competent. The risks and                         benefits of the procedure and the sedation options and                         risks were discussed with the patient. All questions                         were answered and informed consent was obtained.                         Patient identification and proposed procedure were                         verified by the physician, the nurse, the                         anesthesiologist, the anesthetist and the technician                         in the pre-procedure area in the procedure room in the                         endoscopy suite. Mental Status Examination: alert and                         oriented. Airway Examination: normal oropharyngeal  airway and neck mobility. Respiratory Examination:                         clear to auscultation. CV Examination: normal.                         Prophylactic Antibiotics: The patient  does not require                         prophylactic antibiotics. Prior Anticoagulants: The                         patient has taken no previous anticoagulant or                         antiplatelet agents. ASA Grade Assessment: III - A                         patient with severe systemic disease. After reviewing                         the risks and benefits, the patient was deemed in                         satisfactory condition to undergo the procedure. The                         anesthesia plan was to use general anesthesia.                         Immediately prior to administration of medications,                         the patient was re-assessed for adequacy to receive                         sedatives. The heart rate, respiratory rate, oxygen                         saturations, blood pressure, adequacy of pulmonary                         ventilation, and response to care were monitored                         throughout the procedure. The physical status of the                         patient was re-assessed after the procedure.                        After obtaining informed consent, the colonoscope was                         passed under direct vision. Throughout the procedure,                         the patient's blood pressure, pulse, and oxygen  saturations were monitored continuously. The                         Colonoscope was introduced through the anus and                         advanced to the the terminal ileum, with                         identification of the appendiceal orifice and IC                         valve. The colonoscopy was performed without                         difficulty. The patient tolerated the procedure well.                         The quality of the bowel preparation was evaluated                         using the BBPS [facility] Bowel Preparation Scale) with                         scores of: Right Colon = 3,  Transverse Colon = 3 and                         Left Colon = 3 (entire mucosa seen well with no                         residual staining, small fragments of stool or opaque                         liquid). The total BBPS score equals 9. Findings:      The perianal and digital rectal examinations were normal. Pertinent       negatives include normal sphincter tone and no palpable rectal lesions.      The terminal ileum appeared normal.      A 20 mm polyp was found in the proximal ascending colon. The polyp was       carpet-like and flat. Preparations were made for mucosal resection.       Chromoscopy with methylene blue was done to mark the borders of the       lesion. Eleview was injected with adequate lift of the lesion from the       muscularis propria. Snare mucosal resection with Jabier Mutton net and suction       (via the working channel) retrieval was performed. A 20 mm area was       resected. Resection and retrieval were complete. A slow ooze remained at       the end of the procedure. To prevent bleeding after mucosal resection,       three hemostatic clips were successfully placed (MR conditional). There       was no bleeding at the end of the procedure. Area was tattooed with an       injection of Spot (carbon black).      Five sessile polyps were found in the transverse colon. The polyps were  4 to 7 mm in size. These polyps were removed with a cold snare.       Resection and retrieval were complete. Estimated blood loss: none.      Non-bleeding external and internal hemorrhoids were found during       retroflexion. The hemorrhoids were large. Impression:            - The examined portion of the ileum was normal.                        - One 20 mm polyp in the proximal ascending colon,                         removed with mucosal resection. Resected and                         retrieved. Clips (MR conditional) were placed.                         Tattooed.                         - Five 4 to 7 mm polyps in the transverse colon,                         removed with a cold snare. Resected and retrieved.                        - Non-bleeding external and internal hemorrhoids.                        - Mucosal resection was performed. Resection and                         retrieval were complete. Recommendation:        - Discharge patient to home (with escort).                        - Resume previous diet today.                        - Continue present medications.                        - Await pathology results.                        - Repeat colonoscopy in 1 year for surveillance after                         piecemeal polypectomy. Procedure Code(s):     --- Professional ---                        [id number], Colonoscopy, flexible; with endoscopic mucosal                         resection                        45385, 15, Colonoscopy, flexible; with removal of  tumor(s), polyp(s), or other lesion(s) by snare                         technique Diagnosis Code(s):     --- Professional ---                        K63.5, Polyp of colon                        Z86.010, Personal history of colonic polyps                        K64.8, Other hemorrhoids CPT copyright 2019 American Medical Association. All rights reserved. The codes documented in this report are preliminary and upon coder review may  be revised to meet current compliance requirements. Dr. Ulyess Mort Lin Landsman MD, MD 08/18/2021 8:58:10 AM This report has been signed electronically. Number of Addenda: 0 Note Initiated On: 08/18/2021 8:11 AM Scope Withdrawal Time: 0 hours 31 minutes 58 seconds  Total Procedure Duration: 0 hours 33 minutes 36 seconds  Estimated Blood Loss:  Estimated blood loss: none.      [facility]

## 2021-08-18 NOTE — Transfer of Care (Signed)
Immediate Anesthesia Transfer of Care Note  Patient: Mathew Hill  Procedure(s) Performed: COLONOSCOPY WITH BIOPSY (Rectum) POLYPECTOMY (Rectum)  Patient Location: PACU  Anesthesia Type: General  Level of Consciousness: awake, alert  and patient cooperative  Airway and Oxygen Therapy: Patient Spontanous Breathing and Patient connected to supplemental oxygen  Post-op Assessment: Post-op Vital signs reviewed, Patient's Cardiovascular Status Stable, Respiratory Function Stable, Patent Airway and No signs of Nausea or vomiting  Post-op Vital Signs: Reviewed and stable  Complications: No notable events documented.

## 2021-08-18 NOTE — Anesthesia Preprocedure Evaluation (Signed)
Anesthesia Evaluation  Patient identified by MRN, date of birth, ID band Patient awake    Reviewed: Allergy & Precautions, H&P , NPO status , Patient's Chart, lab work & pertinent test results, reviewed documented beta blocker date and time   Airway Mallampati: II  TM Distance: >3 FB Neck ROM: full    Dental no notable dental hx.    Pulmonary neg pulmonary ROS, Current Smoker,    Pulmonary exam normal breath sounds clear to auscultation       Cardiovascular Exercise Tolerance: Good hypertension (starting meds next month (usually runs 140s SBP)), negative cardio ROS   Rhythm:regular Rate:Normal     Neuro/Psych negative neurological ROS  negative psych ROS   GI/Hepatic negative GI ROS, Neg liver ROS, GERD  ,  Endo/Other  negative endocrine ROSdiabetes (preDM)  Renal/GU negative Renal ROS  negative genitourinary   Musculoskeletal  (+) Arthritis ,   Abdominal   Peds  Hematology negative hematology ROS (+)   Anesthesia Other Findings   Reproductive/Obstetrics negative OB ROS                             Anesthesia Physical Anesthesia Plan  ASA: 2  Anesthesia Plan: General   Post-op Pain Management:    Induction:   PONV Risk Score and Plan: 1 and Propofol infusion, TIVA and Treatment may vary due to age or medical condition  Airway Management Planned:   Additional Equipment:   Intra-op Plan:   Post-operative Plan:   Informed Consent: I have reviewed the patients History and Physical, chart, labs and discussed the procedure including the risks, benefits and alternatives for the proposed anesthesia with the patient or authorized representative who has indicated his/her understanding and acceptance.     Dental Advisory Given  Plan Discussed with: CRNA  Anesthesia Plan Comments:         Anesthesia Quick Evaluation

## 2021-08-18 NOTE — Anesthesia Postprocedure Evaluation (Signed)
Anesthesia Post Note  Patient: Mathew Hill  Procedure(s) Performed: COLONOSCOPY WITH BIOPSY (Rectum) POLYPECTOMY (Rectum)     Patient location during evaluation: PACU Anesthesia Type: General Level of consciousness: awake and alert Pain management: pain level controlled Vital Signs Assessment: post-procedure vital signs reviewed and stable Respiratory status: spontaneous breathing, nonlabored ventilation and respiratory function stable Cardiovascular status: blood pressure returned to baseline and stable Postop Assessment: no apparent nausea or vomiting Anesthetic complications: no   No notable events documented.  April Manson

## 2021-08-18 NOTE — H&P (Signed)
Cephas Darby, MD [address]  [place]  [place], [place] [address]  Main: [phone]  Fax: [phone] Pager: [phone]  Primary Care Physician:  Jerrol Banana., MD Primary Gastroenterologist:  Dr. Cephas Darby  Pre-Procedure History & Physical: HPI:  Mathew Hill is a 70 y.o. male is here for an colonoscopy.   Past Medical History:  Diagnosis Date   Cataract    Hyperlipidemia    Hypertension    Prediabetes    Wears partial dentures     Past Surgical History:  Procedure Laterality Date   APPENDECTOMY     CARDIAC CATHETERIZATION  04/23/2009   CATARACT EXTRACTION, BILATERAL     HERNIA REPAIR     TONSILLECTOMY      Prior to Admission medications   Medication Sig Start Date End Date Taking? Authorizing Provider  ALPRAZolam Duanne Moron) 1 MG tablet TAKE 1 TABLET BY MOUTH AT BEDTIME AS NEEDED FOR ANXIETY 02/09/21  Yes Jerrol Banana., MD  Cholecalciferol (VITAMIN D3) 5000 UNITS TABS Take 1 tablet by mouth daily.   Yes [provider]  indomethacin (INDOCIN) 50 MG capsule Take 1 capsule (50 mg total) by mouth 3 (three) times daily as needed. 03/09/17  Yes Jerrol Banana., MD  omeprazole (PRILOSEC) 40 MG capsule Take 1 capsule by mouth once daily 01/28/21  Yes Jerrol Banana., MD  simvastatin [facility]) 40 MG tablet Take 1 tablet by mouth once daily 01/28/21  Yes Jerrol Banana., MD  telmisartan (MICARDIS) 20 MG tablet Take 1 tablet (20 mg total) by mouth daily. 07/20/21  Yes Jerrol Banana., MD  venlafaxine XR (EFFEXOR-XR) 75 MG 24 hr capsule Take 3 capsules (225 mg total) by mouth daily. 12/07/20  Yes Jerrol Banana., MD  colchicine 0.6 MG tablet Take 0.6 mg by mouth 2 (two) times daily. Patient not taking: No sig reported 08/29/19   [provider]  diclofenac sodium (VOLTAREN) 1 % GEL APPLY 2 GRAMS TOPICALLY 4 TIMES DAILY AS NEEDED (KNEE PAIN) Patient not taking: No sig reported 12/04/18   [provider]  fluocinonide cream (LIDEX) 1.60 % Apply 1 application topically 2 (two) times daily. Patient not taking: No sig reported 05/28/17   Carmon Ginsberg, PA  ibuprofen (ADVIL,MOTRIN) 600 MG tablet Take 1 tablet (600 mg total) by mouth every 6 (six) hours as needed for moderate pain. Patient not taking: No sig reported 04/13/16   Melony Overly, MD  mometasone (ELOCON) 0.1 % cream Apply 1 application topically daily. Patient not taking: No sig reported 08/20/17   Jerrol Banana., MD  naproxen (NAPROSYN) 500 MG tablet Take 1 tablet (500 mg total) by mouth 2 (two) times daily with a meal. Patient not taking: No sig reported 11/04/18   Jerrol Banana., MD  Testosterone (ANDROGEL PUMP) 20.25 MG/ACT (1.62%) GEL 2 pumps under each daily Patient not taking: No sig reported 02/25/18   Jerrol Banana., MD    Allergies as of 07/20/2021 - Review Complete 07/20/2021  Allergen Reaction Noted   Codeine Other (See Comments) 03/28/2015    Family History  Problem Relation Age of Onset   Dementia Mother    Heart attack Sister    Depression Sister    Heart disease Sister     Social History   Socioeconomic History   Marital status: Married    Spouse name: Not on file   Number of children: 1  Years of education: Not on file   Highest education level: High school graduate  Occupational History   Occupation: retired  Tobacco Use   Smoking status: Every Day    Packs/day: 0.50    Years: 45.00    Pack years: 22.50    Types: Cigarettes   Smokeless tobacco: Never   Tobacco comments:    he quit but has started back but is not smoking as much.   Vaping Use   Vaping Use: Former   Devices: Used for 1 year  Substance and Sexual Activity   Alcohol use: Yes    Alcohol/week: 0.0 standard drinks    Comment: 1 drink seldomly   Drug use: No   Sexual activity: Not on file  Other Topics Concern   Not on file  Social History Narrative   Not on file   Social Determinants of  Health   Financial Resource Strain: Low Risk    Difficulty of Paying Living Expenses: Not hard at all  Food Insecurity: No Food Insecurity   Worried About Charity fundraiser in the Last Year: Never true   Vander in the Last Year: Never true  Transportation Needs: No Transportation Needs   Lack of Transportation (Medical): No   Lack of Transportation (Non-Medical): No  Physical Activity: Inactive   Days of Exercise per Week: 0 days   Minutes of Exercise per Session: 0 min  Stress: No Stress Concern Present   Feeling of Stress : Only a little  Social Connections: Moderately Isolated   Frequency of Communication with Friends and Family: More than three times a week   Frequency of Social Gatherings with Friends and Family: More than three times a week   Attends Religious Services: Never   Marine scientist or Organizations: No   Attends Music therapist: Never   Marital Status: Married  Human resources officer Violence: Not At Risk   Fear of Current or Ex-Partner: No   Emotionally Abused: No   Physically Abused: No   Sexually Abused: No    Review of Systems: See HPI, otherwise negative ROS  Physical Exam: BP (!) 180/97   Pulse 80   Temp 97.7 F (36.5 C)   Ht 5\' 11"  (1.803 m)   Wt 102.1 kg   SpO2 99%   BMI 31.38 kg/m  General:   Alert,  pleasant and cooperative in NAD Head:  Normocephalic and atraumatic. Neck:  Supple; no masses or thyromegaly. Lungs:  Clear throughout to auscultation.    Heart:  Regular rate and rhythm. Abdomen:  Soft, nontender and nondistended. Normal bowel sounds, without guarding, and without rebound.   Neurologic:  Alert and  oriented x4;  grossly normal neurologically.  Impression/Plan: Mathew Hill is here for an colonoscopy to be performed for h/o adenoma colon  Risks, benefits, limitations, and alternatives regarding  colonoscopy have been reviewed with the patient.  Questions have been answered.  All parties  agreeable.   Sherri Sear, MD  08/18/2021, 7:38 AM

## 2021-08-18 NOTE — Anesthesia Procedure Notes (Signed)
Date/Time: 08/18/2021 8:42 AM Performed by: Cameron Ali, CRNA Pre-anesthesia Checklist: Patient identified, Emergency Drugs available, Suction available, Timeout performed and Patient being monitored Patient Re-evaluated:Patient Re-evaluated prior to induction Oxygen Delivery Method: Nasal cannula Placement Confirmation: positive ETCO2

## 2021-08-19 ENCOUNTER — Encounter: Payer: Self-pay | Admitting: Gastroenterology

## 2021-08-19 LAB — SURGICAL PATHOLOGY

## 2021-08-22 ENCOUNTER — Telehealth: Payer: Self-pay

## 2021-08-22 NOTE — Telephone Encounter (Signed)
-----   Message from Lin Landsman, MD sent at 08/19/2021 11:33 AM EDT ----- Please inform patient that the large polyp that I removed in his right colon did not show any cancer.  There is small amount of residual polyp that in that area and I recommend that he gets colonoscopy in 6 months to remove rest of the polyp  The other polyps were benign  Rohini Vanga

## 2021-08-22 NOTE — Telephone Encounter (Signed)
Called patient and he understands his results and will follow up in six months for another colonoscopy

## 2021-08-22 NOTE — Telephone Encounter (Signed)
CALLED PATIENT NO ANSWER LEFT VOICEMAIL FOR A CALL BACK ? ?

## 2021-08-30 ENCOUNTER — Other Ambulatory Visit: Payer: Self-pay | Admitting: Adult Health

## 2021-08-30 ENCOUNTER — Ambulatory Visit: Payer: Self-pay

## 2021-08-30 DIAGNOSIS — F419 Anxiety disorder, unspecified: Secondary | ICD-10-CM

## 2021-08-30 NOTE — Telephone Encounter (Signed)
Called pt back. Pt wanting to know if he can start the telmisartan.  Pt stated he has not started it and was supposed to call Dr. Rosanna Randy about med. Pt stated when he had  colonoscopy, they found a polyp. Pt stated only half was taken out due to excessive bleeding.  Pt has to go back in 6 months to have rest of polyp removed.  Please advise pt.      Reason for Disposition  [1] Caller has NON-URGENT medicine question about med that PCP prescribed AND [2] triager unable to answer question  Answer Assessment - Initial Assessment Questions 1. NAME of MEDICATION: "What medicine are you calling about?"     telmisartan 2. QUESTION: "What is your question?" (e.g., double dose of medicine, side effect)     Wants to know if he can or cannot start the Telmisartan 3. PRESCRIBING HCP: "Who prescribed it?" Reason: if prescribed by specialist, call should be referred to that group.     Dr Rosanna Randy 4. SYMPTOMS: "Do you have any symptoms?"     no 5. SEVERITY: If symptoms are present, ask "Are they mild, moderate or severe?"     N/a 6. PREGNANCY:  "Is there any chance that you are pregnant?" "When was your last menstrual period?"     N/a  Protocols used: Medication Question Call-A-AH

## 2021-08-30 NOTE — Telephone Encounter (Signed)
Please advise 

## 2021-09-01 MED ORDER — ALPRAZOLAM 1 MG PO TABS: ORAL_TABLET | ORAL | 3 refills | Status: DC

## 2021-09-08 NOTE — Telephone Encounter (Signed)
Patient was advised.  

## 2021-09-12 ENCOUNTER — Other Ambulatory Visit: Payer: Self-pay | Admitting: Family Medicine

## 2021-09-12 DIAGNOSIS — E7800 Pure hypercholesterolemia, unspecified: Secondary | ICD-10-CM

## 2021-09-12 DIAGNOSIS — K219 Gastro-esophageal reflux disease without esophagitis: Secondary | ICD-10-CM

## 2021-09-12 NOTE — Telephone Encounter (Signed)
Requested Prescriptions  Pending Prescriptions Disp Refills  . simvastatin (ZOCOR) 40 MG tablet [Pharmacy Med Name: Simvastatin 40 MG Oral Tablet] 90 tablet 3    Sig: Take 1 tablet by mouth once daily     Cardiovascular:  Antilipid - Statins Failed - 09/12/2021  1:56 PM      Failed - HDL in normal range and within 360 days    HDL  Date Value Ref Range Status  07/20/2021 31 (L) >39 mg/dL Final         Passed - Total Cholesterol in normal range and within 360 days    Cholesterol, Total  Date Value Ref Range Status  07/20/2021 126 100 - 199 mg/dL Final         Passed - LDL in normal range and within 360 days    LDL Chol Calc (NIH)  Date Value Ref Range Status  07/20/2021 73 0 - 99 mg/dL Final         Passed - Triglycerides in normal range and within 360 days    Triglycerides  Date Value Ref Range Status  07/20/2021 123 0 - 149 mg/dL Final         Passed - Patient is not pregnant      Passed - Valid encounter within last 12 months    Recent Outpatient Visits          1 month ago Annual physical exam   [facility] Jerrol Banana., MD   1 year ago Type 2 diabetes mellitus without complication, unspecified whether long term insulin use [facility])   [facility] Jerrol Banana., MD   1 year ago Non-intractable vomiting with nausea, unspecified vomiting type   [facility] Jerrol Banana., MD   2 years ago Type 2 diabetes mellitus without complication, unspecified whether long term insulin use [facility])   [facility] Jerrol Banana., MD   2 years ago Type 2 diabetes mellitus without complication, unspecified whether long term insulin use [facility])   [facility] Jerrol Banana., MD      Future Appointments            In 2 months Jerrol Banana., MD [facility]           . omeprazole (PRILOSEC) 40 MG capsule [Pharmacy Med Name: Omeprazole 40 MG Oral  Capsule Delayed Release] 90 capsule 3    Sig: Take 1 capsule by mouth once daily     Gastroenterology: Proton Pump Inhibitors Passed - 09/12/2021  1:56 PM      Passed - Valid encounter within last 12 months    Recent Outpatient Visits          1 month ago Annual physical exam   [facility] Jerrol Banana., MD   1 year ago Type 2 diabetes mellitus without complication, unspecified whether long term insulin use [facility])   [facility] Jerrol Banana., MD   1 year ago Non-intractable vomiting with nausea, unspecified vomiting type   [facility] Jerrol Banana., MD   2 years ago Type 2 diabetes mellitus without complication, unspecified whether long term insulin use [facility])   [facility] Jerrol Banana., MD   2 years ago Type 2 diabetes mellitus without complication, unspecified whether long term insulin use [facility])   [facility] Jerrol Banana., MD  Future Appointments            In 2 months Jerrol Banana., MD [facility] [place]

## 2021-09-23 ENCOUNTER — Other Ambulatory Visit: Payer: Self-pay | Admitting: Family Medicine

## 2021-09-23 DIAGNOSIS — F32A Depression, unspecified: Secondary | ICD-10-CM

## 2021-11-14 ENCOUNTER — Ambulatory Visit: Payer: Medicare Other | Admitting: Family Medicine

## 2021-12-20 ENCOUNTER — Ambulatory Visit: Payer: Self-pay | Admitting: *Deleted

## 2021-12-20 NOTE — Telephone Encounter (Signed)
Patient declined appointment. Patient states he will just keep the appointment he has on 12/29/2021.

## 2021-12-20 NOTE — Telephone Encounter (Signed)
Patient was advised. Patient wanted to know if he can be seen tomorrow instead? He does not have transportation for today?

## 2021-12-20 NOTE — Telephone Encounter (Signed)
Summary: Gout flare up   Pt called and stated that he is having a gout flare up and would like to know if a clinical message can be sent to the provider for medication. Please advise        Chief Complaint: requesting indomethacin for gout flare up  Symptoms: pain in right outer ankle , pain if "sheet " touches ankle in bed. Using crutch to walk due to pain.  Frequency: x 3 days  Pertinent Negatives: Patient denies fever, swelling , redness Disposition: [] ED /[] Urgent Care (no appt availability in office) / [] Appointment(In office/virtual)/ []  [place] [facility]/ [] Home Care/ [] Refused Recommended Disposition /[] [place] [facility]/ [x]  Follow-up with PCP Additional Notes:   Pt requesting if medication can be renewed due to being seen for issue before. Please advise is appt needed. None available until tomorrow via VV. Patient would like a call back if possible.        Reason for Disposition  [1] MODERATE pain (e.g., interferes with normal activities, limping) AND [2] present > 3 days  Answer Assessment - Initial Assessment Questions 1. ONSET: "When did the pain start?"      3 days  2. LOCATION: "Where is the pain located?"      Right outer ankle  3. PAIN: "How bad is the pain?"    (Scale 1-10; or mild, moderate, severe)  - MILD (1-3): doesn't interfere with normal activities.   - MODERATE (4-7): interferes with normal activities (e.g., work or school) or awakens from sleep, limping.   - SEVERE (8-10): excruciating pain, unable to do any normal activities, unable to walk.      Pain if sheet touches it , walks with a crutch 4. WORK OR EXERCISE: "Has there been any recent work or exercise that involved this part of the body?"      na 5. CAUSE: "What do you think is causing the ankle pain?"     Gout  6. OTHER SYMPTOMS: "Do you have any other symptoms?" (e.g., calf pain, rash, fever, swelling)     No  7. PREGNANCY: "Is there any chance you are pregnant?" "When was your last  menstrual period?"     na  Protocols used: Ankle Pain-A-AH

## 2021-12-20 NOTE — Telephone Encounter (Signed)
Appointment with me at 220

## 2021-12-29 ENCOUNTER — Ambulatory Visit (INDEPENDENT_AMBULATORY_CARE_PROVIDER_SITE_OTHER): Payer: Medicare Other | Admitting: Family Medicine

## 2021-12-29 ENCOUNTER — Other Ambulatory Visit: Payer: Self-pay

## 2021-12-29 VITALS — BP 175/91 | HR 72 | Temp 98.5°F | Wt 220.0 lb

## 2021-12-29 DIAGNOSIS — R030 Elevated blood-pressure reading, without diagnosis of hypertension: Secondary | ICD-10-CM

## 2021-12-29 DIAGNOSIS — M109 Gout, unspecified: Secondary | ICD-10-CM

## 2021-12-29 DIAGNOSIS — E119 Type 2 diabetes mellitus without complications: Secondary | ICD-10-CM | POA: Diagnosis not present

## 2021-12-29 DIAGNOSIS — E785 Hyperlipidemia, unspecified: Secondary | ICD-10-CM

## 2021-12-29 DIAGNOSIS — K219 Gastro-esophageal reflux disease without esophagitis: Secondary | ICD-10-CM

## 2021-12-29 MED ORDER — INDOMETHACIN 50 MG PO CAPS: 50.0000 mg | ORAL_CAPSULE | Freq: Three times a day (TID) | ORAL | 3 refills | Status: AC | PRN

## 2021-12-29 MED ORDER — INDOMETHACIN 50 MG PO CAPS: 50.0000 mg | ORAL_CAPSULE | Freq: Three times a day (TID) | ORAL | 1 refills | Status: DC | PRN

## 2021-12-29 NOTE — Progress Notes (Signed)
?  ? ?I,Elena D DeSanto,acting as a scribe for [name], MD.,have documented all relevant documentation on the behalf of [name], MD,as directed by  [name], MD while in the presence of [name], MD. ? ? ?Established patient visit ? ? ?Patient: Mathew Hill   DOB: Dec 09, 1950   71 y.o. Male  MRN: [id number] ?Visit Date: 12/29/2021 ? ?Today's healthcare provider: [name], MD  ? ?No chief complaint on file. ? ?Subjective  ?  ?HPI  ?Overall patient is doing pretty well with this diabetes and his habits.  He did stop his toes of his foot and this causes gout to flare in his toe and now on his ankle.  He has had some left over Indocin that actually worked and it is much better.  His first flare  has had in 4 to 5 years. ? ?Pre-Diabetes Mellitus Type II, Follow-up ? ?Lab Results  ?Component Value Date  ? HGBA1C 6.0 (H) 07/20/2021  ? HGBA1C 5.8 (H) 08/30/2020  ? HGBA1C 6.1 (A) 04/22/2019  ? ?Wt Readings from Last 3 Encounters:  ?12/29/21 220 lb (99.8 kg)  ?08/18/21 225 lb (102.1 kg)  ?07/20/21 224 lb (101.6 kg)  ? ?Last seen for diabetes 4 months ago.  ?Management since then includes none. ?He reports good compliance with treatment. ?He is not having side effects.  ?Symptoms: ?No fatigue No foot ulcerations  ?No appetite changes No nausea  ?No paresthesia of the feet  No polydipsia  ?No polyuria No visual disturbances   ?No vomiting   ? ? ?Home blood sugar records:  not being checked ? ? ?Pertinent Labs: ?Lab Results  ?Component Value Date  ? CHOL 126 07/20/2021  ? HDL 31 (L) 07/20/2021  ? [place] 73 07/20/2021  ? TRIG 123 07/20/2021  ? CHOLHDL 4.1 07/20/2021  ? Lab Results  ?Component Value Date  ? NA 144 07/20/2021  ? K 4.5 07/20/2021  ? CREATININE 1.35 (H) 07/20/2021  ? EGFR 58 (L) 07/20/2021  ?  ? ?--------------------------------------------------------------------------------------------------- ?Hypertension, follow-up ? ?BP Readings from Last 3 Encounters:   ?12/29/21 (!) 175/91  ?08/18/21 (!) 152/88  ?07/20/21 (!) 148/81  ? Wt Readings from Last 3 Encounters:  ?12/29/21 220 lb (99.8 kg)  ?08/18/21 225 lb (102.1 kg)  ?07/20/21 224 lb (101.6 kg)  ?  ? ?He was last seen for hypertension 5 months ago.  ?BP at that visit was as above. Management since that visit includes starting Micardis. ? ?He reports good compliance with treatment. ?He is not having side effects.  ? ?Symptoms: ?No chest pain No chest pressure  ?No palpitations No syncope  ?No dyspnea No orthopnea  ?No paroxysmal nocturnal dyspnea No lower extremity edema  ? ?Pertinent labs: ?Lab Results  ?Component Value Date  ? CHOL 126 07/20/2021  ? HDL 31 (L) 07/20/2021  ? [place] 73 07/20/2021  ? TRIG 123 07/20/2021  ? CHOLHDL 4.1 07/20/2021  ? Lab Results  ?Component Value Date  ? NA 144 07/20/2021  ? K 4.5 07/20/2021  ? CREATININE 1.35 (H) 07/20/2021  ? EGFR 58 (L) 07/20/2021  ? GLUCOSE 113 (H) 07/20/2021  ? TSH 0.797 07/20/2021  ?  ? ?The ASCVD Risk score (Arnett DK, et al., 2019) failed to calculate for the following reasons: ?  The valid total cholesterol range is 130 to 320 mg/dL  ? ?--------------------------------------------------------------------------------------------------- ? ? ? ?Medications: ?Outpatient Medications Prior to Visit  ?Medication Sig  ? ALPRAZolam (XANAX) 1 MG tablet  1 po q hs prn sleep  ? Cholecalciferol (VITAMIN D3) 5000 UNITS TABS Take 1 tablet by mouth daily.  ? indomethacin (INDOCIN) 50 MG capsule Take 1 capsule (50 mg total) by mouth 3 (three) times daily as needed.  ? omeprazole (PRILOSEC) 40 MG capsule Take 1 capsule by mouth once daily  ? simvastatin (ZOCOR) 40 MG tablet Take 1 tablet by mouth once daily  ? telmisartan (MICARDIS) 20 MG tablet Take 1 tablet (20 mg total) by mouth daily.  ? venlafaxine XR (EFFEXOR-XR) 75 MG 24 hr capsule TAKE 3 CAPSULES BY MOUTH ONCE DAILY  ? ?No facility-administered medications prior to visit.  ? ? ?Review of Systems ? ?  ?  Objective  ?  ?BP  (!) 175/91 (BP Location: Left Arm, Patient Position: Sitting, Cuff Size: Large)   Pulse 72   Temp 98.5 ?F (36.9 ?C) (Oral)   Wt 220 lb (99.8 kg)   SpO2 98%   BMI 30.68 kg/m?  ?BP Readings from Last 3 Encounters:  ?12/29/21 (!) 175/91  ?08/18/21 (!) 152/88  ?07/20/21 (!) 148/81  ? ?Wt Readings from Last 3 Encounters:  ?12/29/21 220 lb (99.8 kg)  ?08/18/21 225 lb (102.1 kg)  ?07/20/21 224 lb (101.6 kg)  ? ?  ? ?Physical Exam ?Vitals reviewed.  ?Constitutional:   ?   Appearance: He is well-developed.  ?HENT:  ?   Head: Normocephalic and atraumatic.  ?   Right Ear: External ear normal.  ?   Left Ear: External ear normal.  ?Eyes:  ?   General: No scleral icterus. ?Neck:  ?   Thyroid: No thyromegaly.  ?Cardiovascular:  ?   Rate and Rhythm: Normal rate and regular rhythm.  ?   Heart sounds: Normal heart sounds.  ?Pulmonary:  ?   Effort: Pulmonary effort is normal.  ?   Breath sounds: Normal breath sounds.  ?Abdominal:  ?   Palpations: Abdomen is soft.  ?Lymphadenopathy:  ?   Cervical: No cervical adenopathy.  ?Skin: ?   General: Skin is warm and dry.  ?Neurological:  ?   Mental Status: He is alert and oriented to person, place, and time.  ?Psychiatric:     ?   Behavior: Behavior normal.     ?   Thought Content: Thought content normal.     ?   Judgment: Judgment normal.  ?  ? ? ?No results found for any visits on 12/29/21. ? Assessment & Plan  ?  ? ?1. Type 2 diabetes mellitus without complication, unspecified whether long term insulin use ([place]) ?Good control. ?Trolled with diet and exercise alone. ?2. Gout of right ankle, unspecified cause, unspecified chronicity ?Discussion with patient we will just give him the Indocin 50 mg 3 times daily as needed flares as this is the first flare in 3 or 4 years.  No need to check uric acid at this time. ? ?3. Acute gout involving toe, unspecified cause, unspecified laterality ?Improved ?- indomethacin (INDOCIN) 50 MG capsule; Take 1 capsule (50 mg total) by mouth 3 (three) times  daily as needed.  Dispense: 90 capsule; Refill: 3 ? ?4. Hyperlipidemia, unspecified hyperlipidemia type ?On Zocor 40 ? ?5. Elevated BP without diagnosis of hypertension ?On telmisartan 20 ? ?6. Gastroesophageal reflux disease, unspecified whether esophagitis present ?On omeprazole. ? ? ?No follow-ups on file.  ?   ? ?I, [name], MD, have reviewed all documentation for this visit. The documentation on 12/31/21 for the exam, diagnosis, procedures, and orders are all  accurate and complete. ? ? ? ?Aziah Brostrom Cranford Mon, MD  ?[facility] ?[phone] (phone) ?[phone] (fax) ? ?[facility] ?

## 2022-01-24 ENCOUNTER — Other Ambulatory Visit: Payer: Self-pay | Admitting: Family Medicine

## 2022-01-24 DIAGNOSIS — F419 Anxiety disorder, unspecified: Secondary | ICD-10-CM

## 2022-03-29 ENCOUNTER — Ambulatory Visit (INDEPENDENT_AMBULATORY_CARE_PROVIDER_SITE_OTHER): Payer: Medicare Other

## 2022-03-29 VITALS — Wt 220.0 lb

## 2022-03-29 DIAGNOSIS — Z0000 Encounter for general adult medical examination without abnormal findings: Secondary | ICD-10-CM

## 2022-03-29 NOTE — Progress Notes (Signed)
Virtual Visit via Telephone Note  I connected with  Mathew Hill on 03/29/22 at  2:15 PM EDT by telephone and verified that I am speaking with the correct person using two identifiers.  Location: Patient: home Provider: [facility] Persons participating in the virtual visit: [place]   I discussed the limitations, risks, security and privacy concerns of performing an evaluation and management service by telephone and the availability of in person appointments. The patient expressed understanding and agreed to proceed.  Interactive audio and video telecommunications were attempted between this nurse and patient, however failed, due to patient having technical difficulties OR patient did not have access to video capability.  We continued and completed visit with audio only.  Some vital signs may be absent or patient reported.   Mathew David, LPN  Subjective:   Mathew Hill is a 71 y.o. male who presents for Medicare Annual/Subsequent preventive examination.  Review of Systems           Objective:    There were no vitals filed for this visit. There is no height or weight on file to calculate BMI.     08/18/2021    7:13 AM 10/07/2020   11:01 AM 04/20/2015    2:26 PM 04/14/2015    7:06 PM  Advanced Directives  Does Patient Have a Medical Advance Directive? No Yes No No  Type of Corporate treasurer of [place];Living will    Copy of [place] in Chart?  No - copy requested    Would patient like information on creating a medical advance directive? No - Patient declined   No - patient declined information    Current Medications (verified) Outpatient Encounter Medications as of 03/29/2022  Medication Sig   ALPRAZolam (XANAX) 1 MG tablet TAKE 1 TABLET BY MOUTH EVERY DAY AT BEDTIME AS NEEDED FOR SLEEP   Cholecalciferol (VITAMIN D3) 5000 UNITS TABS Take 1 tablet by mouth daily.   indomethacin (INDOCIN) 50 MG capsule Take 1  capsule (50 mg total) by mouth 3 (three) times daily as needed.   omeprazole (PRILOSEC) 40 MG capsule Take 1 capsule by mouth once daily   simvastatin (ZOCOR) 40 MG tablet Take 1 tablet by mouth once daily   telmisartan (MICARDIS) 20 MG tablet Take 1 tablet (20 mg total) by mouth daily.   venlafaxine XR (EFFEXOR-XR) 75 MG 24 hr capsule TAKE 3 CAPSULES BY MOUTH ONCE DAILY   No facility-administered encounter medications on file as of 03/29/2022.    Allergies (verified) Codeine   History: Past Medical History:  Diagnosis Date   Cataract    Hyperlipidemia    Hypertension    Prediabetes    Wears partial dentures    Past Surgical History:  Procedure Laterality Date   APPENDECTOMY     CARDIAC CATHETERIZATION  04/23/2009   CATARACT EXTRACTION, BILATERAL     COLONOSCOPY WITH PROPOFOL N/A 08/18/2021   Procedure: COLONOSCOPY WITH BIOPSY;  Surgeon: Lin Landsman, MD;  Location: [place];  Service: Endoscopy;  Laterality: N/A;   HERNIA REPAIR     POLYPECTOMY N/A 08/18/2021   Procedure: POLYPECTOMY;  Surgeon: Lin Landsman, MD;  Location: [place];  Service: Endoscopy;  Laterality: N/A;  Clip x 3 - ASCENDING COLON POLYP REMOVAL SITE Tattoo - ASCENDING COLON POLYP REMOVAL SITE   TONSILLECTOMY     Family History  Problem Relation Age of Onset   Dementia Mother    Heart attack Sister  Depression Sister    Heart disease Sister    Social History   Socioeconomic History   Marital status: Married    Spouse name: Not on file   Number of children: 1   Years of education: Not on file   Highest education level: High school graduate  Occupational History   Occupation: retired  Tobacco Use   Smoking status: Every Day    Packs/day: 0.50    Years: 45.00    Pack years: 22.50    Types: Cigarettes   Smokeless tobacco: Never   Tobacco comments:    he quit but has started back but is not smoking as much.   Vaping Use   Vaping Use: Former   Devices: Used  for 1 year  Substance and Sexual Activity   Alcohol use: Yes    Alcohol/week: 0.0 standard drinks    Comment: 1 drink seldomly   Drug use: No   Sexual activity: Not on file  Other Topics Concern   Not on file  Social History Narrative   Not on file   Social Determinants of Health   Financial Resource Strain: Not on file  Food Insecurity: Not on file  Transportation Needs: Not on file  Physical Activity: Not on file  Stress: Not on file  Social Connections: Not on file    Tobacco Counseling Ready to quit: Not Answered Counseling given: Not Answered Tobacco comments: he quit but has started back but is not smoking as much.    Clinical Intake:  Pre-visit preparation completed: Yes  Pain : No/denies pain     Nutritional Risks: None Diabetes: No CBG done?: No Did pt. bring in CBG monitor from home?: No  How often do you need to have someone help you when you read instructions, pamphlets, or other written materials from your doctor or pharmacy?: 1 - Never  Diabetic?no  Interpreter Needed?: No  Information entered by :: Kirke Shaggy, LPN   Activities of Daily Living    12/29/2021    3:50 PM 08/18/2021    7:04 AM  In your present state of health, do you have any difficulty performing the following activities:  Hearing? 0 0  Vision? 0 0  Difficulty concentrating or making decisions? 0 0  Walking or climbing stairs? 1 0  Dressing or bathing? 0 0  Doing errands, shopping? 0     Patient Care Team: Jerrol Banana., MD as PCP - General (Family Medicine) [place], [place] any recent Medical Services you may have received from other than [facility] providers in the past year (date may be approximate).     Assessment:   This is a routine wellness examination for Mathew Hill.  Hearing/Vision screen No results found.  Dietary issues and exercise activities discussed:     Goals Addressed   None    Depression Screen    12/29/2021    3:50  PM 10/07/2020   10:58 AM 08/30/2020    8:14 AM 02/25/2018    1:45 PM 02/12/2017   10:56 AM  PHQ 2/9 Scores  PHQ - 2 Score 1 0 2 1 0  PHQ- 9 Score '1  12 6 3    '$ Fall Risk    12/29/2021    3:49 PM 10/07/2020   11:02 AM 08/30/2020    8:14 AM  Fall Risk   Falls in the past year? 0 0 0  Number falls in past yr:  0 0  Injury with Fall?  0  0  Follow up   Falls evaluation completed    FALL RISK PREVENTION PERTAINING TO THE HOME:  Any stairs in or around the home? No  If so, are there any without handrails? Yes  Home free of loose throw rugs in walkways, pet beds, electrical cords, etc? Yes  Adequate lighting in your home to reduce risk of falls? Yes   ASSISTIVE DEVICES UTILIZED TO PREVENT FALLS:  Life alert? No  Use of a cane, walker or w/c? Yes  Grab bars in the bathroom? No  Shower chair or bench in shower? Yes  Elevated toilet seat or a handicapped toilet? No    Cognitive Function: 0 points, 6CIT        10/07/2020   11:06 AM  6CIT Screen  What Year? 0 points  What month? 0 points  What time? 0 points  Count back from 20 0 points  Months in reverse 0 points  Repeat phrase 0 points  Total Score 0 points    Immunizations Immunization History  Administered Date(s) Administered   Fluad Quad(high Dose 65+) 07/20/2021   Hpv-Unspecified 08/17/2020   Influenza, High Dose Seasonal PF 08/17/2020   Influenza,inj,Quad PF,6+ Mos 08/20/2017   PFIZER(Purple Top)SARS-COV-2 Vaccination 07/27/2020, 08/17/2020   Td 11/06/2003   Tdap 02/22/2015    TDAP status: Up to date  Flu Vaccine status: Up to date  Pneumococcal vaccine status: Due, Education has been provided regarding the importance of this vaccine. Advised may receive this vaccine at local pharmacy or Health Dept. Aware to provide a copy of the vaccination record if obtained from local pharmacy or Health Dept. Verbalized acceptance and understanding.  Covid-19 vaccine status: Completed vaccines  Qualifies for Shingles  Vaccine? Yes   Zostavax completed No   Shingrix Completed?: No.    Education has been provided regarding the importance of this vaccine. Patient has been advised to call insurance company to determine out of pocket expense if they have not yet received this vaccine. Advised may also receive vaccine at local pharmacy or Health Dept. Verbalized acceptance and understanding.  Screening Tests Health Maintenance  Topic Date Due   Pneumonia Vaccine 72+ Years old (1 - PCV) Never done   Hepatitis C Screening  Never done   Zoster Vaccines- Shingrix (1 of 2) Never done   FOOT EXAM  02/12/2018   COVID-19 Vaccine (3 - Booster for Pfizer series) 10/12/2020   OPHTHALMOLOGY EXAM  03/19/2021   HEMOGLOBIN A1C  01/17/2022   INFLUENZA VACCINE  05/30/2022   COLONOSCOPY (Pts 45-36yr Insurance coverage will need to be confirmed)  08/18/2022   TETANUS/TDAP  02/21/2025   HPV VACCINES  Aged Out    Health Maintenance  Health Maintenance Due  Topic Date Due   Pneumonia Vaccine 69 Years old (1 - PCV) Never done   Hepatitis C Screening  Never done   Zoster Vaccines- Shingrix (1 of 2) Never done   FOOT EXAM  02/12/2018   COVID-19 Vaccine (3 - Booster for Pfizer series) 10/12/2020   OPHTHALMOLOGY EXAM  03/19/2021   HEMOGLOBIN A1C  01/17/2022    Colorectal cancer screening: Type of screening: Colonoscopy. Completed 08/18/21. Repeat every 1 years  Lung Cancer Screening: (Low Dose CT Chest recommended if Age 69-80years, 30 pack-year currently smoking OR have quit w/in 15years.) does not qualify.    Additional Screening:  Hepatitis C Screening: does qualify; Completed no  Vision Screening: Recommended annual ophthalmology exams for early detection of glaucoma and other disorders of the eye. Is  the patient up to date with their annual eye exam?  Yes  Who is the provider or what is the name of the office in which the patient attends annual eye exams? [facility] If pt is not established with a  provider, would they like to be referred to a provider to establish care? No .   Dental Screening: Recommended annual dental exams for proper oral hygiene  Community Resource Referral / Chronic Care Management: CRR required this visit?  No   CCM required this visit?  No      Plan:     I have personally reviewed and noted the following in the patient's chart:   Medical and social history Use of alcohol, tobacco or illicit drugs  Current medications and supplements including opioid prescriptions. Patient is not currently taking opioid prescriptions. Functional ability and status Nutritional status Physical activity Advanced directives List of other physicians Hospitalizations, surgeries, and ER visits in previous 12 months Vitals Screenings to include cognitive, depression, and falls Referrals and appointments  In addition, I have reviewed and discussed with patient certain preventive protocols, quality metrics, and best practice recommendations. A written personalized care plan for preventive services as well as general preventive health recommendations were provided to patient.     Mathew David, LPN   [id number]   Nurse Notes: none

## 2022-03-29 NOTE — Patient Instructions (Signed)
[name] , Thank you for taking time to come for your Medicare Wellness Visit. I appreciate your ongoing commitment to your health goals. Please review the following plan we discussed and let me know if I can assist you in the future.   Screening recommendations/referrals: Colonoscopy: 08/18/21 Recommended yearly ophthalmology/optometry visit for glaucoma screening and checkup Recommended yearly dental visit for hygiene and checkup  Vaccinations: Influenza vaccine: 07/20/21 Pneumococcal vaccine: n/d Tdap vaccine: 02/22/15 Shingles vaccine: n/d   Covid-19: 07/27/20, 08/17/20  Advanced directives: no  Conditions/risks identified: none  Next appointment: Follow up in one year for your annual wellness visit. 04/02/23@ 1:30 pm by phone  Preventive Care 65 Years and Older, Male Preventive care refers to lifestyle choices and visits with your health care provider that can promote health and wellness. What does preventive care include? A yearly physical exam. This is also called an annual well check. Dental exams once or twice a year. Routine eye exams. Ask your health care provider how often you should have your eyes checked. Personal lifestyle choices, including: Daily care of your teeth and gums. Regular physical activity. Eating a healthy diet. Avoiding tobacco and drug use. Limiting alcohol use. Practicing safe sex. Taking low doses of aspirin every day. Taking vitamin and mineral supplements as recommended by your health care provider. What happens during an annual well check? The services and screenings done by your health care provider during your annual well check will depend on your age, overall health, lifestyle risk factors, and family history of disease. Counseling  Your health care provider may ask you questions about your: Alcohol use. Tobacco use. Drug use. Emotional well-being. Home and relationship well-being. Sexual activity. Eating habits. History of  falls. Memory and ability to understand (cognition). Work and work Statistician. Screening  You may have the following tests or measurements: Height, weight, and BMI. Blood pressure. Lipid and cholesterol levels. These may be checked every 5 years, or more frequently if you are over 85 years old. Skin check. Lung cancer screening. You may have this screening every year starting at age 66 if you have a 30-pack-year history of smoking and currently smoke or have quit within the past 15 years. Fecal occult blood test (FOBT) of the stool. You may have this test every year starting at age 12. Flexible sigmoidoscopy or colonoscopy. You may have a sigmoidoscopy every 5 years or a colonoscopy every 10 years starting at age 35. Prostate cancer screening. Recommendations will vary depending on your family history and other risks. Hepatitis C blood test. Hepatitis B blood test. Sexually transmitted disease (STD) testing. Diabetes screening. This is done by checking your blood sugar (glucose) after you have not eaten for a while (fasting). You may have this done every 1-3 years. Abdominal aortic aneurysm (AAA) screening. You may need this if you are a current or former smoker. Osteoporosis. You may be screened starting at age 42 if you are at high risk. Talk with your health care provider about your test results, treatment options, and if necessary, the need for more tests. Vaccines  Your health care provider may recommend certain vaccines, such as: Influenza vaccine. This is recommended every year. Tetanus, diphtheria, and acellular pertussis (Tdap, Td) vaccine. You may need a Td booster every 10 years. Zoster vaccine. You may need this after age 9. Pneumococcal 13-valent conjugate (PCV13) vaccine. One dose is recommended after age 55. Pneumococcal polysaccharide (PPSV23) vaccine. One dose is recommended after age 29. Talk to your health care provider about  which screenings and vaccines you need and  how often you need them. This information is not intended to replace advice given to you by your health care provider. Make sure you discuss any questions you have with your health care provider. Document Released: 11/12/2015 Document Revised: 07/05/2016 Document Reviewed: 08/17/2015 Elsevier Interactive Patient Education  2017 [employer] Prevention in the Home Falls can cause injuries. They can happen to people of all ages. There are many things you can do to make your home safe and to help prevent falls. What can I do on the outside of my home? Regularly fix the edges of walkways and driveways and fix any cracks. Remove anything that might make you trip as you walk through a door, such as a raised step or threshold. Trim any bushes or trees on the path to your home. Use bright outdoor lighting. Clear any walking paths of anything that might make someone trip, such as rocks or tools. Regularly check to see if handrails are loose or broken. Make sure that both sides of any steps have handrails. Any raised decks and porches should have guardrails on the edges. Have any leaves, snow, or ice cleared regularly. Use sand or salt on walking paths during winter. Clean up any spills in your garage right away. This includes oil or grease spills. What can I do in the bathroom? Use night lights. Install grab bars by the toilet and in the tub and shower. Do not use towel bars as grab bars. Use non-skid mats or decals in the tub or shower. If you need to sit down in the shower, use a plastic, non-slip stool. Keep the floor dry. Clean up any water that spills on the floor as soon as it happens. Remove soap buildup in the tub or shower regularly. Attach bath mats securely with double-sided non-slip rug tape. Do not have throw rugs and other things on the floor that can make you trip. What can I do in the bedroom? Use night lights. Make sure that you have a light by your bed that is easy to  reach. Do not use any sheets or blankets that are too big for your bed. They should not hang down onto the floor. Have a firm chair that has side arms. You can use this for support while you get dressed. Do not have throw rugs and other things on the floor that can make you trip. What can I do in the kitchen? Clean up any spills right away. Avoid walking on wet floors. Keep items that you use a lot in easy-to-reach places. If you need to reach something above you, use a strong step stool that has a grab bar. Keep electrical cords out of the way. Do not use floor polish or wax that makes floors slippery. If you must use wax, use non-skid floor wax. Do not have throw rugs and other things on the floor that can make you trip. What can I do with my stairs? Do not leave any items on the stairs. Make sure that there are handrails on both sides of the stairs and use them. Fix handrails that are broken or loose. Make sure that handrails are as long as the stairways. Check any carpeting to make sure that it is firmly attached to the stairs. Fix any carpet that is loose or worn. Avoid having throw rugs at the top or bottom of the stairs. If you do have throw rugs, attach them to the floor with  carpet tape. Make sure that you have a light switch at the top of the stairs and the bottom of the stairs. If you do not have them, ask someone to add them for you. What else can I do to help prevent falls? Wear shoes that: Do not have high heels. Have rubber bottoms. Are comfortable and fit you well. Are closed at the toe. Do not wear sandals. If you use a stepladder: Make sure that it is fully opened. Do not climb a closed stepladder. Make sure that both sides of the stepladder are locked into place. Ask someone to hold it for you, if possible. Clearly mark and make sure that you can see: Any grab bars or handrails. First and last steps. Where the edge of each step is. Use tools that help you move  around (mobility aids) if they are needed. These include: Canes. Walkers. Scooters. Crutches. Turn on the lights when you go into a dark area. Replace any light bulbs as soon as they burn out. Set up your furniture so you have a clear path. Avoid moving your furniture around. If any of your floors are uneven, fix them. If there are any pets around you, be aware of where they are. Review your medicines with your doctor. Some medicines can make you feel dizzy. This can increase your chance of falling. Ask your doctor what other things that you can do to help prevent falls. This information is not intended to replace advice given to you by your health care provider. Make sure you discuss any questions you have with your health care provider. Document Released: 08/12/2009 Document Revised: 03/23/2016 Document Reviewed: 11/20/2014 Elsevier Interactive Patient Education  2017 [employer].

## 2022-06-12 DIAGNOSIS — Z961 Presence of intraocular lens: Secondary | ICD-10-CM | POA: Diagnosis not present

## 2022-06-12 LAB — HM DIABETES EYE EXAM

## 2022-06-27 ENCOUNTER — Encounter: Payer: Self-pay | Admitting: Family Medicine

## 2022-06-27 ENCOUNTER — Telehealth: Payer: Self-pay

## 2022-06-27 ENCOUNTER — Ambulatory Visit (INDEPENDENT_AMBULATORY_CARE_PROVIDER_SITE_OTHER): Payer: Medicare Other | Admitting: Family Medicine

## 2022-06-27 ENCOUNTER — Ambulatory Visit: Payer: Self-pay

## 2022-06-27 VITALS — BP 154/79 | HR 70 | Resp 16 | Wt 214.0 lb

## 2022-06-27 DIAGNOSIS — T162XXA Foreign body in left ear, initial encounter: Secondary | ICD-10-CM | POA: Diagnosis not present

## 2022-06-27 DIAGNOSIS — H60502 Unspecified acute noninfective otitis externa, left ear: Secondary | ICD-10-CM

## 2022-06-27 MED ORDER — NEOMYCIN-POLYMYXIN-HC 1 % OT SOLN: 3.0000 [drp] | Freq: Three times a day (TID) | OTIC | 0 refills | Status: DC

## 2022-06-27 MED ORDER — AMOXICILLIN-POT CLAVULANATE 875-125 MG PO TABS
1.0000 | ORAL_TABLET | Freq: Two times a day (BID) | ORAL | 0 refills | Status: DC
Start: 2022-06-27 — End: 2022-07-13

## 2022-06-27 NOTE — Telephone Encounter (Signed)
  Chief Complaint: Cotton from Q-tip stuck in ear. Ear swelling, possible tooth infection Symptoms: ear canal swollen shut Frequency: Sunday Pertinent Negatives: Patient denies pain Disposition: '[]'$ ED /'[]'$ Urgent Care (no appt availability in office) / '[x]'$ Appointment(In office/virtual)/ '[]'$  [facility]/ '[]'$ Home Care/ '[]'$ Refused Recommended Disposition /'[]'$ [place]/ '[]'$  Follow-up with PCP Additional Notes: Pt states that ear started swelling Sunday. Pt used a q-tip this morning and the cotton is stuck in his ear. Pt states there is no pain.    Summary: cotton stuck in ear   Pt states his rt ear is swollen and cotton is now stuck in his ear   Pt has an appt today 8-29 @ 2:00 and inquiring if it's ok for the cotton to stay in his ear until then   Pt denies any pain   Please assist further      Reason for Disposition  Ear congestion present > 48 hours  Answer Assessment - Initial Assessment Questions 1. LOCATION: "Which ear is involved?"       Right 2. SENSATION: "Describe how the ear feels." (e.g. stuffy, full, plugged)."      Like finger in ear 3. ONSET:  "When did the ear symptoms start?"       Swelling started Sunday 4. PAIN: "Do you also have an earache?" If Yes, ask: "How bad is it?" (Scale 1-10; or mild, moderate, severe)     no 5. CAUSE: "What do you think is causing the ear congestion?"     Crown 6. URI: "Do you have a runny nose or cough?"      no 7. NASAL ALLERGIES: "Are there symptoms of hay fever, such as sneezing or a clear nasal discharge?"     no 8. PREGNANCY: "Is there any chance you are pregnant?" "When was your last menstrual period?"     na  Protocols used: Ear - Congestion-A-AH

## 2022-06-27 NOTE — Progress Notes (Unsigned)
      Established patient visit  I,[name],acting as a scribe for [name], MD.,have documented all relevant documentation on the behalf of [name], MD,as directed by  [name], MD while in the presence of [name], MD.   Patient: Mathew Hill   DOB: [birth year]   71 y.o. Male  MRN: [id number] Visit Date: 06/27/2022  Today's healthcare provider: [name], MD   Chief Complaint  Patient presents with   Foreign Body in Ear   Subjective    HPI  Patient is here concerning right pain and swelling x3 days. Patient has cotton stuck in his ear.  Medications: Outpatient Medications Prior to Visit  Medication Sig   ALPRAZolam (XANAX) 1 MG tablet TAKE 1 TABLET BY MOUTH EVERY DAY AT BEDTIME AS NEEDED FOR SLEEP   Cholecalciferol (VITAMIN D3) 5000 UNITS TABS Take 1 tablet by mouth daily.   indomethacin (INDOCIN) 50 MG capsule Take 1 capsule (50 mg total) by mouth 3 (three) times daily as needed.   omeprazole (PRILOSEC) 40 MG capsule Take 1 capsule by mouth once daily   simvastatin (ZOCOR) 40 MG tablet Take 1 tablet by mouth once daily   telmisartan (MICARDIS) 20 MG tablet Take 1 tablet (20 mg total) by mouth daily.   venlafaxine XR (EFFEXOR-XR) 75 MG 24 hr capsule TAKE 3 CAPSULES BY MOUTH ONCE DAILY   No facility-administered medications prior to visit.    Review of Systems  Constitutional:  Negative for appetite change, chills and fever.  Respiratory:  Negative for chest tightness, shortness of breath and wheezing.   Cardiovascular:  Negative for chest pain and palpitations.  Gastrointestinal:  Negative for abdominal pain, nausea and vomiting.    {Labs  Heme  Chem  Endocrine  Serology  Results Review (optional):23779}   Objective    BP (!) 154/79 (BP Location: Left Arm, Patient Position: Sitting, Cuff Size: Large)   Pulse 70   Resp 16   Wt 214 lb (97.1 kg)   SpO2 99%   BMI 29.85 kg/m  {Show previous vital signs  (optional):23777}  Physical Exam  ***  No results found for any visits on 06/27/22.  Assessment & Plan     ***  No follow-ups on file.      {provider attestation***:1}   [name], MD  [facility] [phone] (phone) [phone] (fax)  [place]

## 2022-06-27 NOTE — Telephone Encounter (Signed)
Yes--3 times a day for now.

## 2022-06-27 NOTE — Telephone Encounter (Signed)
Advised  Copied from [place] [id number]. Topic: General - Inquiry >> Jun 27, 2022  2:53 PM Mathew Hill wrote: Reason for CRM: Pt called asking if he is supposed to put the oil in his ear with the cotton still in his ear.  He was in the office today. CB@  [phone]

## 2022-07-13 ENCOUNTER — Ambulatory Visit (INDEPENDENT_AMBULATORY_CARE_PROVIDER_SITE_OTHER): Payer: Medicare Other | Admitting: Family Medicine

## 2022-07-13 ENCOUNTER — Encounter: Payer: Self-pay | Admitting: Family Medicine

## 2022-07-13 VITALS — BP 127/80 | HR 75 | Temp 98.2°F | Wt 213.0 lb

## 2022-07-13 DIAGNOSIS — E7800 Pure hypercholesterolemia, unspecified: Secondary | ICD-10-CM | POA: Diagnosis not present

## 2022-07-13 DIAGNOSIS — E785 Hyperlipidemia, unspecified: Secondary | ICD-10-CM

## 2022-07-13 DIAGNOSIS — E119 Type 2 diabetes mellitus without complications: Secondary | ICD-10-CM | POA: Diagnosis not present

## 2022-07-13 DIAGNOSIS — H60502 Unspecified acute noninfective otitis externa, left ear: Secondary | ICD-10-CM

## 2022-07-13 DIAGNOSIS — K219 Gastro-esophageal reflux disease without esophagitis: Secondary | ICD-10-CM

## 2022-07-13 DIAGNOSIS — Z23 Encounter for immunization: Secondary | ICD-10-CM | POA: Diagnosis not present

## 2022-07-13 DIAGNOSIS — R030 Elevated blood-pressure reading, without diagnosis of hypertension: Secondary | ICD-10-CM | POA: Diagnosis not present

## 2022-07-13 DIAGNOSIS — F32A Depression, unspecified: Secondary | ICD-10-CM | POA: Diagnosis not present

## 2022-07-13 DIAGNOSIS — D123 Benign neoplasm of transverse colon: Secondary | ICD-10-CM | POA: Diagnosis not present

## 2022-07-13 NOTE — Progress Notes (Unsigned)
I,Roshena L Chambers,acting as a scribe for [name], MD.,have documented all relevant documentation on the behalf of [name], MD,as directed by  [name], MD while in the presence of [name], MD.   Established patient visit   Patient: Mathew Hill   DOB: [birth year]   71 y.o. Male  MRN: [id number] Visit Date: 07/13/2022  Today's healthcare provider: [name], MD   Chief Complaint  Patient presents with   Diabetes   Hypertension   Hyperlipidemia   Subjective    HPI  Patient feels well.  He has no complaints.  His ear is back to normal and was the day after he was last seen.  He is tolerating his medications well and has no complaints today.  Diabetes Mellitus Type II, follow-up  Lab Results  Component Value Date   HGBA1C 6.0 (H) 07/20/2021   HGBA1C 5.8 (H) 08/30/2020   HGBA1C 6.1 (A) 04/22/2019   Last seen for diabetes 6 months ago.  Management since then includes continuing the same treatment.  Home blood sugar records:  blood sugars are not checked Most Recent Eye Exam: 05/2023 [facility]  --------------------------------------------------------------------------------------------------- Hypertension, follow-up  BP Readings from Last 3 Encounters:  07/13/22 127/80  06/27/22 (!) 154/79  12/29/21 (!) 175/91   Wt Readings from Last 3 Encounters:  07/13/22 213 lb (96.6 kg)  06/27/22 214 lb (97.1 kg)  03/29/22 220 lb (99.8 kg)     He was last seen for hypertension 6 months ago.  Management since that visit includes; On telmisartan 20.  Outside blood pressures are checked occasionally.  --------------------------------------------------------------------------------------------------- Lipid/Cholesterol, follow-up  Last Lipid Panel: Lab Results  Component Value Date   CHOL 126 07/20/2021   LDLCALC 73 07/20/2021   HDL 31 (L) 07/20/2021   TRIG 123 07/20/2021    He was last seen for this 1  years ago.  Management since that visit includes; On Zocor 40.  Last metabolic panel Lab Results  Component Value Date   GLUCOSE 113 (H) 07/20/2021   NA 144 07/20/2021   K 4.5 07/20/2021   BUN 16 07/20/2021   CREATININE 1.35 (H) 07/20/2021   EGFR 58 (L) 07/20/2021   GFRNONAA 60 08/30/2020   CALCIUM 10.1 07/20/2021   AST 14 07/20/2021   ALT 11 07/20/2021   The ASCVD Risk score (Arnett DK, et al., 2019) failed to calculate for the following reasons:   The valid total cholesterol range is 130 to 320 mg/dL  ---------------------------------------------------------------------------------------------------   Medications: Outpatient Medications Prior to Visit  Medication Sig   ALPRAZolam (XANAX) 1 MG tablet TAKE 1 TABLET BY MOUTH EVERY DAY AT BEDTIME AS NEEDED FOR SLEEP   Cholecalciferol (VITAMIN D3) 5000 UNITS TABS Take 1 tablet by mouth daily.   indomethacin (INDOCIN) 50 MG capsule Take 1 capsule (50 mg total) by mouth 3 (three) times daily as needed.   omeprazole (PRILOSEC) 40 MG capsule Take 1 capsule by mouth once daily   simvastatin (ZOCOR) 40 MG tablet Take 1 tablet by mouth once daily   telmisartan (MICARDIS) 20 MG tablet Take 1 tablet (20 mg total) by mouth daily.   venlafaxine XR (EFFEXOR-XR) 75 MG 24 hr capsule TAKE 3 CAPSULES BY MOUTH ONCE DAILY   [DISCONTINUED] amoxicillin-clavulanate (AUGMENTIN) 875-125 MG tablet Take 1 tablet by mouth 2 (two) times daily. (Patient not taking: Reported on 07/13/2022)   [DISCONTINUED] NEOMYCIN-POLYMYXIN-HYDROCORTISONE (CORTISPORIN) 1 % SOLN OTIC solution Place 3 drops into the left  ear every 8 (eight) hours. (Patient not taking: Reported on 07/13/2022)   No facility-administered medications prior to visit.    Review of Systems  Constitutional:  Negative for appetite change, chills and fever.  Respiratory:  Negative for chest tightness, shortness of breath and wheezing.   Cardiovascular:  Negative for chest pain and palpitations.   Gastrointestinal:  Negative for abdominal pain, nausea and vomiting.        Objective    BP 127/80 (BP Location: Right Arm, Patient Position: Sitting, Cuff Size: Large)   Pulse 75   Temp 98.2 F (36.8 C) (Oral)   Wt 213 lb (96.6 kg)   SpO2 97% Comment: room air  BMI 29.71 kg/m  BP Readings from Last 3 Encounters:  07/13/22 127/80  06/27/22 (!) 154/79  12/29/21 (!) 175/91   Wt Readings from Last 3 Encounters:  07/13/22 213 lb (96.6 kg)  06/27/22 214 lb (97.1 kg)  03/29/22 220 lb (99.8 kg)      Physical Exam Vitals reviewed.  Constitutional:      Appearance: He is well-developed.  HENT:     Head: Normocephalic and atraumatic.     Right Ear: External ear normal.     Left Ear: External ear normal.  Eyes:     General: No scleral icterus. Neck:     Thyroid: No thyromegaly.  Cardiovascular:     Rate and Rhythm: Normal rate and regular rhythm.     Heart sounds: Normal heart sounds.  Pulmonary:     Effort: Pulmonary effort is normal.     Breath sounds: Normal breath sounds.  Abdominal:     Palpations: Abdomen is soft.  Lymphadenopathy:     Cervical: No cervical adenopathy.  Skin:    General: Skin is warm and dry.  Neurological:     Mental Status: He is alert and oriented to person, place, and time.  Psychiatric:        Behavior: Behavior normal.        Thought Content: Thought content normal.        Judgment: Judgment normal.       No results found for any visits on 07/13/22.  Assessment & Plan     1. Type 2 diabetes mellitus without complication, unspecified whether long term insulin use (HCC) Goal A1c less than 6.5. - Lipid panel - TSH - CBC w/Diff/Platelet - Comprehensive Metabolic Panel (CMET) - Hemoglobin A1c  2. Hyperlipidemia, unspecified hyperlipidemia type On simvastatin 40 for years - Lipid panel - TSH - CBC w/Diff/Platelet - Comprehensive Metabolic Panel (CMET) - Hemoglobin A1c  3. Elevated BP without diagnosis of hypertension On  telmisartan 20 mg - Lipid panel - TSH - CBC w/Diff/Platelet - Comprehensive Metabolic Panel (CMET) - Hemoglobin A1c  4. Gastroesophageal reflux disease, unspecified whether esophagitis present Needs omeprazole daily - Lipid panel - TSH - CBC w/Diff/Platelet - Comprehensive Metabolic Panel (CMET) - Hemoglobin A1c  5. Depression, unspecified depression type Stable on venlafaxine - Lipid panel - TSH - CBC w/Diff/Platelet - Comprehensive Metabolic Panel (CMET) - Hemoglobin A1c  6. Need for influenza vaccination  - Flu Vaccine QUAD High Dose IM (Fluad)  7. Need for vaccination against Streptococcus pneumoniae  - Pneumococcal conjugate vaccine 20-valent (PCV20)  8. Adenomatous polyp of transverse colon Repeat colonoscopy 5 to 7 years  9. Hypercholesterolemia without hypertriglyceridemia  10 acute otitis externa Resolved  No follow-ups on file.      I, [name], MD, have reviewed all documentation for this visit. The  documentation on 07/17/22 for the exam, diagnosis, procedures, and orders are all accurate and complete.    Maliik Karner Cranford Mon, MD  [facility] [phone] (phone) [phone] (fax)  [place]

## 2022-08-16 ENCOUNTER — Other Ambulatory Visit: Payer: Self-pay | Admitting: Family Medicine

## 2022-08-16 DIAGNOSIS — R030 Elevated blood-pressure reading, without diagnosis of hypertension: Secondary | ICD-10-CM

## 2022-08-16 DIAGNOSIS — F419 Anxiety disorder, unspecified: Secondary | ICD-10-CM

## 2022-08-17 NOTE — Telephone Encounter (Signed)
Requested medication (s) are due for refill today - yes  Requested medication (s) are on the active medication list -yes  Future visit scheduled -no  Last refill: Alprazolam 01/22/22 #30 3RF                 Indomethacin 12/29/21 #90 3RF  Notes to clinic: Attempted to contact patient to schedule appointment with new provider- no answer, mailbox full- unable to leave message. Non delegated Rx  Requested Prescriptions  Pending Prescriptions Disp Refills   ALPRAZolam (XANAX) 1 MG tablet [Pharmacy Med Name: ALPRAZolam 1 MG Oral Tablet] 30 tablet 0    Sig: TAKE 1 TABLET BY MOUTH AT BEDTIME AS NEEDED FOR SLEEP     Not Delegated - Psychiatry: Anxiolytics/Hypnotics 2 Failed - 08/16/2022  8:19 PM      Failed - This refill cannot be delegated      Failed - Urine Drug Screen completed in last 360 days      Passed - Patient is not pregnant      Passed - Valid encounter within last 6 months    Recent Outpatient Visits           1 month ago Type 2 diabetes mellitus without complication, unspecified whether long term insulin use ([place])   [facility] Jerrol Banana., MD   1 month ago Acute otitis externa of left ear, unspecified type   [facility] Jerrol Banana., MD   7 months ago Type 2 diabetes mellitus without complication, unspecified whether long term insulin use ([place])   [facility] Jerrol Banana., MD   1 year ago Annual physical exam   [facility] Jerrol Banana., MD   1 year ago Type 2 diabetes mellitus without complication, unspecified whether long term insulin use ([place])   [facility] Jerrol Banana., MD               telmisartan (MICARDIS) 20 MG tablet [Pharmacy Med Name: Telmisartan 20 MG Oral Tablet] 90 tablet 0    Sig: Take 1 tablet by mouth once daily     Cardiovascular:  Angiotensin Receptor Blockers Failed - 08/16/2022  8:19 PM      Failed - Cr in normal range  and within 180 days    Creatinine, Ser  Date Value Ref Range Status  07/20/2021 1.35 (H) 0.76 - 1.27 mg/dL Final         Failed - K in normal range and within 180 days    Potassium  Date Value Ref Range Status  07/20/2021 4.5 3.5 - 5.2 mmol/L Final         Passed - Patient is not pregnant      Passed - Last BP in normal range    BP Readings from Last 1 Encounters:  07/13/22 127/80         Passed - Valid encounter within last 6 months    Recent Outpatient Visits           1 month ago Type 2 diabetes mellitus without complication, unspecified whether long term insulin use [facility])   [facility] Jerrol Banana., MD   1 month ago Acute otitis externa of left ear, unspecified type   [facility] Jerrol Banana., MD   7 months ago Type 2 diabetes mellitus without complication, unspecified whether long term insulin use [facility])   [facility] Jerrol Banana., MD  1 year ago Annual physical exam   [facility] Jerrol Banana., MD   1 year ago Type 2 diabetes mellitus without complication, unspecified whether long term insulin use [facility])   [facility] Jerrol Banana., MD                 Requested Prescriptions  Pending Prescriptions Disp Refills   ALPRAZolam Duanne Moron) 1 MG tablet [Pharmacy Med Name: ALPRAZolam 1 MG Oral Tablet] 30 tablet 0    Sig: TAKE 1 TABLET BY MOUTH AT BEDTIME AS NEEDED FOR SLEEP     Not Delegated - Psychiatry: Anxiolytics/Hypnotics 2 Failed - 08/16/2022  8:19 PM      Failed - This refill cannot be delegated      Failed - Urine Drug Screen completed in last 360 days      Passed - Patient is not pregnant      Passed - Valid encounter within last 6 months    Recent Outpatient Visits           1 month ago Type 2 diabetes mellitus without complication, unspecified whether long term insulin use ([place])   [facility] Jerrol Banana.,  MD   1 month ago Acute otitis externa of left ear, unspecified type   [facility] Jerrol Banana., MD   7 months ago Type 2 diabetes mellitus without complication, unspecified whether long term insulin use ([place])   [facility] Jerrol Banana., MD   1 year ago Annual physical exam   [facility] Jerrol Banana., MD   1 year ago Type 2 diabetes mellitus without complication, unspecified whether long term insulin use ([place])   [facility] Jerrol Banana., MD               telmisartan (MICARDIS) 20 MG tablet [Pharmacy Med Name: Telmisartan 20 MG Oral Tablet] 90 tablet 0    Sig: Take 1 tablet by mouth once daily     Cardiovascular:  Angiotensin Receptor Blockers Failed - 08/16/2022  8:19 PM      Failed - Cr in normal range and within 180 days    Creatinine, Ser  Date Value Ref Range Status  07/20/2021 1.35 (H) 0.76 - 1.27 mg/dL Final         Failed - K in normal range and within 180 days    Potassium  Date Value Ref Range Status  07/20/2021 4.5 3.5 - 5.2 mmol/L Final         Passed - Patient is not pregnant      Passed - Last BP in normal range    BP Readings from Last 1 Encounters:  07/13/22 127/80         Passed - Valid encounter within last 6 months    Recent Outpatient Visits           1 month ago Type 2 diabetes mellitus without complication, unspecified whether long term insulin use [facility])   [facility] Jerrol Banana., MD   1 month ago Acute otitis externa of left ear, unspecified type   [facility] Jerrol Banana., MD   7 months ago Type 2 diabetes mellitus without complication, unspecified whether long term insulin use [facility])   [facility] Jerrol Banana., MD   1 year ago Annual physical exam   [place] Jerrol Banana., MD  1 year ago Type 2 diabetes mellitus without complication,  unspecified whether long term insulin use [facility])   [facility] Jerrol Banana., MD

## 2022-09-19 ENCOUNTER — Ambulatory Visit: Payer: Medicare Other | Admitting: Family Medicine

## 2023-11-15 ENCOUNTER — Encounter: Payer: Self-pay | Admitting: *Deleted
